# Patient Record
Sex: Male | Born: 1945 | Race: White | Hispanic: No | Marital: Married | Smoking: Former smoker
Health system: Southern US, Community
[De-identification: ages and names within clinical notes are randomized; demographics above are authoritative.]

## PROBLEM LIST (undated history)

## (undated) ENCOUNTER — Emergency Department (HOSPITAL_COMMUNITY): Admission: EM | Payer: Medicare Other

## (undated) DIAGNOSIS — Z8719 Personal history of other diseases of the digestive system: Secondary | ICD-10-CM

## (undated) DIAGNOSIS — M549 Dorsalgia, unspecified: Secondary | ICD-10-CM

## (undated) DIAGNOSIS — I10 Essential (primary) hypertension: Secondary | ICD-10-CM

## (undated) DIAGNOSIS — M1990 Unspecified osteoarthritis, unspecified site: Secondary | ICD-10-CM

## (undated) DIAGNOSIS — G4730 Sleep apnea, unspecified: Secondary | ICD-10-CM

## (undated) DIAGNOSIS — K219 Gastro-esophageal reflux disease without esophagitis: Secondary | ICD-10-CM

## (undated) DIAGNOSIS — G8929 Other chronic pain: Secondary | ICD-10-CM

## (undated) DIAGNOSIS — Z8711 Personal history of peptic ulcer disease: Secondary | ICD-10-CM

## (undated) DIAGNOSIS — G510 Bell's palsy: Secondary | ICD-10-CM

## (undated) DIAGNOSIS — Z87442 Personal history of urinary calculi: Secondary | ICD-10-CM

## (undated) DIAGNOSIS — J189 Pneumonia, unspecified organism: Secondary | ICD-10-CM

## (undated) HISTORY — PX: CATARACT EXTRACTION W/ INTRAOCULAR LENS  IMPLANT, BILATERAL: SHX1307

## (undated) HISTORY — PX: FRACTURE SURGERY: SHX138

## (undated) HISTORY — PX: TONSILLECTOMY: SUR1361

## (undated) HISTORY — PX: EXTERNAL FIXATION ARM: SHX1552

## (undated) HISTORY — PX: JOINT REPLACEMENT: SHX530

## (undated) HISTORY — PX: BACK SURGERY: SHX140

## (undated) SURGERY — ARTHROSCOPY, KNEE
Anesthesia: General | Laterality: Right

---
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
## 1968-10-01 HISTORY — PX: HAND SURGERY: SHX662

## 1983-10-02 HISTORY — PX: FOOT SURGERY: SHX648

## 1983-10-02 HISTORY — PX: HAND HARDWARE REMOVAL: SUR1125

## 1996-10-01 HISTORY — PX: LAMINECTOMY AND MICRODISCECTOMY THORACIC SPINE: SHX1915

## 2008-03-24 ENCOUNTER — Emergency Department (HOSPITAL_COMMUNITY): Admission: EM | Admit: 2008-03-24 | Discharge: 2008-03-24 | Payer: Self-pay | Admitting: Emergency Medicine

## 2008-03-25 ENCOUNTER — Emergency Department (HOSPITAL_COMMUNITY): Admission: EM | Admit: 2008-03-25 | Discharge: 2008-03-25 | Payer: Self-pay | Admitting: Emergency Medicine

## 2010-11-17 ENCOUNTER — Other Ambulatory Visit: Payer: Self-pay | Admitting: Family Medicine

## 2010-11-17 DIAGNOSIS — M545 Low back pain: Secondary | ICD-10-CM

## 2010-11-20 ENCOUNTER — Other Ambulatory Visit: Payer: Self-pay | Admitting: Family Medicine

## 2010-11-20 ENCOUNTER — Ambulatory Visit
Admission: RE | Admit: 2010-11-20 | Discharge: 2010-11-20 | Disposition: A | Discharge status: Home / Self Care | Payer: Medicaid Other | Source: Ambulatory Visit | Attending: Family Medicine | Admitting: Family Medicine

## 2010-11-20 DIAGNOSIS — W3400XA Accidental discharge from unspecified firearms or gun, initial encounter: Secondary | ICD-10-CM

## 2010-11-22 ENCOUNTER — Other Ambulatory Visit: Payer: Self-pay

## 2011-01-17 ENCOUNTER — Ambulatory Visit: Payer: Medicaid Other | Attending: Neurosurgery | Admitting: Rehabilitative and Restorative Service Providers"

## 2011-01-17 DIAGNOSIS — M2569 Stiffness of other specified joint, not elsewhere classified: Secondary | ICD-10-CM | POA: Insufficient documentation

## 2011-01-17 DIAGNOSIS — M545 Low back pain, unspecified: Secondary | ICD-10-CM | POA: Insufficient documentation

## 2011-01-17 DIAGNOSIS — IMO0001 Reserved for inherently not codable concepts without codable children: Secondary | ICD-10-CM | POA: Insufficient documentation

## 2011-01-29 ENCOUNTER — Ambulatory Visit: Payer: Medicaid Other

## 2011-02-05 ENCOUNTER — Ambulatory Visit: Payer: Medicaid Other | Attending: Neurosurgery | Admitting: Rehabilitative and Restorative Service Providers"

## 2011-02-05 DIAGNOSIS — M545 Low back pain, unspecified: Secondary | ICD-10-CM | POA: Insufficient documentation

## 2011-02-05 DIAGNOSIS — M2569 Stiffness of other specified joint, not elsewhere classified: Secondary | ICD-10-CM | POA: Insufficient documentation

## 2011-02-05 DIAGNOSIS — IMO0001 Reserved for inherently not codable concepts without codable children: Secondary | ICD-10-CM | POA: Insufficient documentation

## 2011-02-12 ENCOUNTER — Ambulatory Visit: Payer: Medicaid Other | Admitting: Rehabilitative and Restorative Service Providers"

## 2011-02-14 ENCOUNTER — Encounter: Payer: Medicaid Other | Admitting: Rehabilitative and Restorative Service Providers"

## 2011-06-28 LAB — DIFFERENTIAL
Basophils Relative: 0
Eosinophils Absolute: 0.2
Neutrophils Relative %: 62

## 2011-06-28 LAB — CBC
MCHC: 34.4
MCV: 94.6
Platelets: 353
RDW: 12.8
WBC: 9.3

## 2011-06-28 LAB — POCT I-STAT, CHEM 8
Calcium, Ion: 0.99 — ABNORMAL LOW
Creatinine, Ser: 1.3
Glucose, Bld: 137 — ABNORMAL HIGH
HCT: 42
Hemoglobin: 14.3

## 2011-06-28 LAB — PROTIME-INR
INR: 0.9
Prothrombin Time: 12.5

## 2011-09-04 ENCOUNTER — Other Ambulatory Visit: Payer: Self-pay | Admitting: Orthopedic Surgery

## 2011-09-04 ENCOUNTER — Ambulatory Visit
Admission: RE | Admit: 2011-09-04 | Discharge: 2011-09-04 | Disposition: A | Discharge status: Home / Self Care | Payer: Medicare Other | Source: Ambulatory Visit | Attending: Orthopedic Surgery | Admitting: Orthopedic Surgery

## 2011-09-04 DIAGNOSIS — R609 Edema, unspecified: Secondary | ICD-10-CM

## 2011-09-04 DIAGNOSIS — R52 Pain, unspecified: Secondary | ICD-10-CM

## 2012-12-08 ENCOUNTER — Other Ambulatory Visit: Payer: Self-pay | Admitting: Orthopedic Surgery

## 2012-12-08 ENCOUNTER — Ambulatory Visit
Admission: RE | Admit: 2012-12-08 | Discharge: 2012-12-08 | Disposition: A | Discharge status: Home / Self Care | Payer: Medicare Other | Source: Ambulatory Visit | Attending: Orthopedic Surgery | Admitting: Orthopedic Surgery

## 2012-12-15 ENCOUNTER — Encounter (HOSPITAL_COMMUNITY): Payer: Self-pay | Admitting: Pharmacy Technician

## 2012-12-16 ENCOUNTER — Encounter (HOSPITAL_COMMUNITY)
Admission: RE | Admit: 2012-12-16 | Discharge: 2012-12-16 | Disposition: A | Discharge status: Home / Self Care | Payer: Medicare Other | Source: Ambulatory Visit | Attending: Orthopedic Surgery | Admitting: Orthopedic Surgery

## 2012-12-16 ENCOUNTER — Other Ambulatory Visit: Payer: Self-pay | Admitting: Orthopedic Surgery

## 2012-12-16 ENCOUNTER — Encounter (HOSPITAL_COMMUNITY): Payer: Self-pay

## 2012-12-16 HISTORY — DX: Gastro-esophageal reflux disease without esophagitis: K21.9

## 2012-12-16 HISTORY — DX: Sleep apnea, unspecified: G47.30

## 2012-12-16 HISTORY — DX: Essential (primary) hypertension: I10

## 2012-12-16 HISTORY — DX: Unspecified osteoarthritis, unspecified site: M19.90

## 2012-12-16 HISTORY — DX: Pneumonia, unspecified organism: J18.9

## 2012-12-16 HISTORY — DX: Bell's palsy: G51.0

## 2012-12-16 LAB — COMPREHENSIVE METABOLIC PANEL
AST: 17 U/L (ref 0–37)
Albumin: 3.7 g/dL (ref 3.5–5.2)
BUN: 17 mg/dL (ref 6–23)
Chloride: 97 mEq/L (ref 96–112)
Creatinine, Ser: 0.75 mg/dL (ref 0.50–1.35)
Total Bilirubin: 0.2 mg/dL — ABNORMAL LOW (ref 0.3–1.2)
Total Protein: 7.2 g/dL (ref 6.0–8.3)

## 2012-12-16 LAB — URINALYSIS, ROUTINE W REFLEX MICROSCOPIC
Glucose, UA: 100 mg/dL — AB
Hgb urine dipstick: NEGATIVE
Leukocytes, UA: NEGATIVE
Specific Gravity, Urine: 1.023 (ref 1.005–1.030)
pH: 7 (ref 5.0–8.0)

## 2012-12-16 LAB — CBC
HCT: 38.3 % — ABNORMAL LOW (ref 39.0–52.0)
MCHC: 35.2 g/dL (ref 30.0–36.0)
MCV: 95.3 fL (ref 78.0–100.0)
Platelets: 414 10*3/uL — ABNORMAL HIGH (ref 150–400)
RDW: 12.5 % (ref 11.5–15.5)
WBC: 16.5 10*3/uL — ABNORMAL HIGH (ref 4.0–10.5)

## 2012-12-16 NOTE — Pre-Procedure Instructions (Addendum)
Jaquae Rieves  12/16/2012   Your procedure is scheduled on:  12/18/12  Report to [facility] at 800 AM.  Call this number if you have problems the morning of surgery: [phone]   Remember:   Do not eat food or drink liquids after midnight.   Take these medicines the morning of surgery with A SIP OF WATER: pain med STOP aspirin, lodine(etodolac) now   Do not wear jewelry, make-up or nail polish.  Do not wear lotions, powders, or perfumes. You may wear deodorant.  Do not shave 48 hours prior to surgery. Men may shave face and neck.  Do not bring valuables to the hospital.  Contacts, dentures or bridgework may not be worn into surgery.  Leave suitcase in the car. After surgery it may be brought to your room.  For patients admitted to the hospital, checkout time is 11:00 AM the day of  discharge.   Patients discharged the day of surgery will not be allowed to drive  home.  Name and phone number of your driver:   Special Instructions: Shower using CHG 2 nights before surgery and the night before surgery.  If you shower the day of surgery use CHG.  Use special wash - you have one bottle of CHG for all showers.  You should use approximately 1/3 of the bottle for each shower.   Please read over the following fact sheets that you were given: Pain Booklet, Coughing and Deep Breathing, MRSA Information and Surgical Site Infection Prevention

## 2012-12-16 NOTE — Progress Notes (Signed)
Called dr Windle Guard office for prior ekg, notes. Not sure if message recorded. Chart left for allison z pa to review.

## 2012-12-17 MED ORDER — VANCOMYCIN HCL IN DEXTROSE 1-5 GM/200ML-% IV SOLN
1000.0000 mg | INTRAVENOUS | Status: DC
  Filled 2012-12-17: qty 200

## 2012-12-17 NOTE — Progress Notes (Addendum)
I received a call from [facility] in the lab , she said that they were unable to add the Diff to CBC done yesterday.  Pt has a cbc and Diff ordered for tomorrow.  I notified Dr Montez Morita.

## 2012-12-17 NOTE — Progress Notes (Signed)
Anesthesia chart review: Patient is a 67 year old male scheduled for right knee arthroscopy with Dr. Montez Morita on 12/18/2012. History includes former smoker, hypertension, pneumonia, GERD, obstructive sleep apnea with occasional use of CPAP, arthritis, Bell's palsy '90's, multiple orthopedic procedures. PCP is Dr. Windle Guard.  CXR on 12/16/12 showed: Hyperexpanded lungs and bronchitic change without acute cardiopulmonary disease.  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 The interpreting cardiologist did not feel that it was significantly changed from his previous EKG on 03/24/08 (see Muse). No CV symptoms were documented at his PAT visit.  He has no known history of MI/CHF or DM.  Pre-operative labs noted.  CBC results called to Dr. Celene Skeen office.  He would like a differential added if possible and to repeat a CBC with differential on arrival tomorrow (ordered).  He will be evaluated by his assigned anesthesiologist on the day of surgery.  If no acute changes and follow-up labs felt acceptable then would anticipate he could proceed as planned.  Velna Ochs [facility]/Anesthesiology Phone [phone] 12/17/2012 1:11 PM

## 2012-12-18 ENCOUNTER — Ambulatory Visit (HOSPITAL_COMMUNITY)
Admission: RE | Admit: 2012-12-18 | Discharge: 2012-12-18 | Disposition: A | Discharge status: Home / Self Care | Payer: Medicare Other | Source: Ambulatory Visit | Attending: Orthopedic Surgery | Admitting: Orthopedic Surgery

## 2012-12-18 ENCOUNTER — Ambulatory Visit (HOSPITAL_COMMUNITY): Payer: Medicare Other | Admitting: Certified Registered"

## 2012-12-18 ENCOUNTER — Encounter (HOSPITAL_COMMUNITY)
Admission: RE | Disposition: A | Discharge status: Home / Self Care | Payer: Self-pay | Source: Ambulatory Visit | Attending: Orthopedic Surgery

## 2012-12-18 ENCOUNTER — Encounter (HOSPITAL_COMMUNITY): Payer: Self-pay | Admitting: Vascular Surgery

## 2012-12-18 ENCOUNTER — Encounter (HOSPITAL_COMMUNITY): Payer: Self-pay | Admitting: *Deleted

## 2012-12-18 DIAGNOSIS — M6750 Plica syndrome, unspecified knee: Secondary | ICD-10-CM | POA: Insufficient documentation

## 2012-12-18 DIAGNOSIS — M959 Acquired deformity of musculoskeletal system, unspecified: Secondary | ICD-10-CM | POA: Insufficient documentation

## 2012-12-18 DIAGNOSIS — Z01812 Encounter for preprocedural laboratory examination: Secondary | ICD-10-CM | POA: Insufficient documentation

## 2012-12-18 DIAGNOSIS — G8918 Other acute postprocedural pain: Secondary | ICD-10-CM

## 2012-12-18 DIAGNOSIS — Z01810 Encounter for preprocedural cardiovascular examination: Secondary | ICD-10-CM | POA: Insufficient documentation

## 2012-12-18 DIAGNOSIS — Z01818 Encounter for other preprocedural examination: Secondary | ICD-10-CM | POA: Insufficient documentation

## 2012-12-18 DIAGNOSIS — M23329 Other meniscus derangements, posterior horn of medial meniscus, unspecified knee: Secondary | ICD-10-CM | POA: Insufficient documentation

## 2012-12-18 DIAGNOSIS — M2340 Loose body in knee, unspecified knee: Secondary | ICD-10-CM | POA: Insufficient documentation

## 2012-12-18 DIAGNOSIS — I10 Essential (primary) hypertension: Secondary | ICD-10-CM | POA: Insufficient documentation

## 2012-12-18 HISTORY — PX: KNEE ARTHROSCOPY: SHX127

## 2012-12-18 LAB — CBC
HCT: 37.3 % — ABNORMAL LOW (ref 39.0–52.0)
Hemoglobin: 12.6 g/dL — ABNORMAL LOW (ref 13.0–17.0)
MCV: 97.6 fL (ref 78.0–100.0)
RBC: 3.82 MIL/uL — ABNORMAL LOW (ref 4.22–5.81)
RDW: 12.9 % (ref 11.5–15.5)
WBC: 9.4 10*3/uL (ref 4.0–10.5)

## 2012-12-18 LAB — DIFFERENTIAL
Basophils Absolute: 0 10*3/uL (ref 0.0–0.1)
Eosinophils Relative: 2 % (ref 0–5)
Lymphocytes Relative: 38 % (ref 12–46)
Lymphs Abs: 3.5 10*3/uL (ref 0.7–4.0)
Monocytes Absolute: 1.1 10*3/uL — ABNORMAL HIGH (ref 0.1–1.0)
Neutro Abs: 4.5 10*3/uL (ref 1.7–7.7)

## 2012-12-18 SURGERY — ARTHROSCOPY, KNEE
Anesthesia: General | Site: Knee | Laterality: Right | Wound class: Clean

## 2012-12-18 MED ORDER — OXYCODONE HCL 5 MG/5ML PO SOLN: 5.0000 mg | Freq: Once | ORAL | Status: DC | PRN

## 2012-12-18 MED ORDER — HYDROMORPHONE HCL PF 1 MG/ML IJ SOLN
INTRAMUSCULAR | Status: AC
  Filled 2012-12-18: qty 1

## 2012-12-18 MED ORDER — MIDAZOLAM HCL 5 MG/5ML IJ SOLN
INTRAMUSCULAR | Status: DC | PRN
  Administered 2012-12-18: 2 mg via INTRAVENOUS

## 2012-12-18 MED ORDER — HYDROMORPHONE HCL PF 1 MG/ML IJ SOLN
0.2500 mg | INTRAMUSCULAR | Status: DC | PRN
  Administered 2012-12-18 (×2): 0.25 mg via INTRAVENOUS

## 2012-12-18 MED ORDER — ONDANSETRON HCL 4 MG/2ML IJ SOLN
INTRAMUSCULAR | Status: DC | PRN
  Administered 2012-12-18: 4 mg via INTRAVENOUS

## 2012-12-18 MED ORDER — OXYCODONE HCL 5 MG PO TABS
5.0000 mg | ORAL_TABLET | Freq: Once | ORAL | Status: DC | PRN
Start: 2012-12-18 — End: 2012-12-18

## 2012-12-18 MED ORDER — BUPIVACAINE HCL (PF) 0.25 % IJ SOLN
INTRAMUSCULAR | Status: DC | PRN
  Administered 2012-12-18: 30 mL via INTRA_ARTICULAR

## 2012-12-18 MED ORDER — KETOROLAC TROMETHAMINE 30 MG/ML IJ SOLN
INTRAMUSCULAR | Status: DC | PRN
  Administered 2012-12-18: 30 mg via INTRAVENOUS

## 2012-12-18 MED ORDER — CHLORHEXIDINE GLUCONATE 4 % EX LIQD: 60.0000 mL | Freq: Once | CUTANEOUS | Status: DC

## 2012-12-18 MED ORDER — LIDOCAINE HCL (CARDIAC) 20 MG/ML IV SOLN
INTRAVENOUS | Status: DC | PRN
  Administered 2012-12-18: 80 mg via INTRAVENOUS

## 2012-12-18 MED ORDER — PROMETHAZINE HCL 25 MG/ML IJ SOLN: 6.2500 mg | INTRAMUSCULAR | Status: DC | PRN

## 2012-12-18 MED ORDER — MEPERIDINE HCL 25 MG/ML IJ SOLN: 6.2500 mg | INTRAMUSCULAR | Status: DC | PRN

## 2012-12-18 MED ORDER — OXYCODONE-ACETAMINOPHEN 7.5-325 MG PO TABS: 1.0000 | ORAL_TABLET | ORAL | Status: DC | PRN

## 2012-12-18 MED ORDER — LACTATED RINGERS IV SOLN
INTRAVENOUS | Status: DC
  Administered 2012-12-18: 09:00:00 via INTRAVENOUS

## 2012-12-18 MED ORDER — FENTANYL CITRATE 0.05 MG/ML IJ SOLN
INTRAMUSCULAR | Status: DC | PRN
  Administered 2012-12-18: 25 ug via INTRAVENOUS

## 2012-12-18 MED ORDER — LACTATED RINGERS IV SOLN
INTRAVENOUS | Status: DC | PRN
  Administered 2012-12-18: 09:00:00 via INTRAVENOUS

## 2012-12-18 MED ORDER — PROPOFOL 10 MG/ML IV BOLUS
INTRAVENOUS | Status: DC | PRN
  Administered 2012-12-18: 170 mg via INTRAVENOUS

## 2012-12-18 MED ORDER — SODIUM CHLORIDE 0.9 % IR SOLN
Status: DC | PRN
  Administered 2012-12-18: 6000 mL

## 2012-12-18 MED ORDER — ACETAMINOPHEN 10 MG/ML IV SOLN
INTRAVENOUS | Status: AC
  Filled 2012-12-18: qty 100

## 2012-12-18 MED ORDER — BUPIVACAINE HCL (PF) 0.25 % IJ SOLN
INTRAMUSCULAR | Status: AC
  Filled 2012-12-18: qty 30

## 2012-12-18 SURGICAL SUPPLY — 37 items
BANDAGE ELASTIC 4 VELCRO ST LF (GAUZE/BANDAGES/DRESSINGS) ×2 IMPLANT
BANDAGE ELASTIC 6 VELCRO ST LF (GAUZE/BANDAGES/DRESSINGS) ×2 IMPLANT
BANDAGE GAUZE ELAST BULKY 4 IN (GAUZE/BANDAGES/DRESSINGS) ×2 IMPLANT
BLADE CUDA 5.5 (BLADE) IMPLANT
BLADE GREAT WHITE 4.2 (BLADE) ×2 IMPLANT
CLOTH BEACON ORANGE TIMEOUT ST (SAFETY) ×2 IMPLANT
COVER SURGICAL LIGHT HANDLE (MISCELLANEOUS) ×2 IMPLANT
CUFF TOURNIQUET SINGLE 34IN LL (TOURNIQUET CUFF) IMPLANT
CUFF TOURNIQUET SINGLE 44IN (TOURNIQUET CUFF) IMPLANT
DECANTER SPIKE VIAL GLASS SM (MISCELLANEOUS) ×2 IMPLANT
DRAPE ARTHROSCOPY W/POUCH 114 (DRAPES) ×2 IMPLANT
DRAPE U-SHAPE 47X51 STRL (DRAPES) ×2 IMPLANT
DRSG EMULSION OIL 3X3 NADH (GAUZE/BANDAGES/DRESSINGS) ×2 IMPLANT
DRSG PAD ABDOMINAL 8X10 ST (GAUZE/BANDAGES/DRESSINGS) ×2 IMPLANT
DURAPREP 26ML APPLICATOR (WOUND CARE) ×2 IMPLANT
GLOVE BIO SURGEON STRL SZ 6.5 (GLOVE) ×2 IMPLANT
GLOVE BIOGEL PI IND STRL 7.0 (GLOVE) ×2 IMPLANT
GLOVE BIOGEL PI IND STRL 7.5 (GLOVE) ×1 IMPLANT
GLOVE BIOGEL PI INDICATOR 7.0 (GLOVE) ×2
GLOVE BIOGEL PI INDICATOR 7.5 (GLOVE) ×1
GLOVE SS PI 9.0 STRL (GLOVE) ×2 IMPLANT
GOWN PREVENTION PLUS XLARGE (GOWN DISPOSABLE) ×2 IMPLANT
GOWN STRL NON-REIN LRG LVL3 (GOWN DISPOSABLE) ×2 IMPLANT
GOWN STRL REIN XL XLG (GOWN DISPOSABLE) ×4 IMPLANT
KIT BASIN OR (CUSTOM PROCEDURE TRAY) ×2 IMPLANT
KIT ROOM TURNOVER OR (KITS) ×2 IMPLANT
MANIFOLD NEPTUNE II (INSTRUMENTS) ×2 IMPLANT
PACK ARTHROSCOPY DSU (CUSTOM PROCEDURE TRAY) ×2 IMPLANT
PAD ARMBOARD 7.5X6 YLW CONV (MISCELLANEOUS) ×4 IMPLANT
SET ARTHROSCOPY TUBING (MISCELLANEOUS) ×2
SET ARTHROSCOPY TUBING LN (MISCELLANEOUS) ×1 IMPLANT
SPONGE GAUZE 4X4 12PLY (GAUZE/BANDAGES/DRESSINGS) ×2 IMPLANT
SPONGE LAP 4X18 X RAY DECT (DISPOSABLE) ×2 IMPLANT
SUT ETHILON 4 0 PS 2 18 (SUTURE) ×2 IMPLANT
SYR CONTROL 10ML LL (SYRINGE) ×2 IMPLANT
TOWEL OR 17X24 6PK STRL BLUE (TOWEL DISPOSABLE) ×2 IMPLANT
WATER STERILE IRR 1000ML POUR (IV SOLUTION) ×2 IMPLANT

## 2012-12-18 NOTE — Preoperative (Signed)
Beta Blockers   Reason not to administer Beta Blockers:Not Applicable 

## 2012-12-18 NOTE — Anesthesia Procedure Notes (Signed)
Procedure Name: LMA Insertion Date/Time: 12/18/2012 9:46 AM Performed by: Jerilee Hoh Pre-anesthesia Checklist: Patient identified, Emergency Drugs available, Suction available and Patient being monitored Patient Re-evaluated:Patient Re-evaluated prior to inductionOxygen Delivery Method: Circle system utilized Preoxygenation: Pre-oxygenation with 100% oxygen Intubation Type: IV induction LMA: LMA inserted LMA Size: 4.0 Tube type: Oral Number of attempts: 1 Placement Confirmation: positive ETCO2 and breath sounds checked- equal and bilateral Tube secured with: Tape Dental Injury: Teeth and Oropharynx as per pre-operative assessment

## 2012-12-18 NOTE — Anesthesia Preprocedure Evaluation (Signed)
Anesthesia Evaluation  Patient identified by MRN, date of birth, ID band Patient awake    Reviewed: Allergy & Precautions, H&P , NPO status , Patient's Chart, lab work & pertinent test results  Airway Mallampati: I  Neck ROM: Full    Dental  (+) Partial Lower and Poor Dentition   Pulmonary sleep apnea , pneumonia -,  breath sounds clear to auscultation        Cardiovascular hypertension, Rhythm:Regular Rate:Normal     Neuro/Psych negative neurological ROS     GI/Hepatic Neg liver ROS, GERD-  ,  Endo/Other    Renal/GU negative Renal ROS     Musculoskeletal   Abdominal   Peds  Hematology   Anesthesia Other Findings   Reproductive/Obstetrics                           Anesthesia Physical Anesthesia Plan  ASA: II  Anesthesia Plan: General   Post-op Pain Management:    Induction: Intravenous  Airway Management Planned: LMA  Additional Equipment:   Intra-op Plan:   Post-operative Plan: Extubation in OR  Informed Consent: I have reviewed the patients History and Physical, chart, labs and discussed the procedure including the risks, benefits and alternatives for the proposed anesthesia with the patient or authorized representative who has indicated his/her understanding and acceptance.   Dental advisory given  Plan Discussed with: CRNA and Surgeon  Anesthesia Plan Comments:         Anesthesia Quick Evaluation

## 2012-12-18 NOTE — Progress Notes (Signed)
Orthopedic Tech Progress Note Patient Details:  Corey Carter 01-24-1946 [id number] Patient provided crutches in short stay. Proper crutch technique discussed with patient. Ortho Devices Type of Ortho Device: Crutches Ortho Device/Splint Interventions: Ordered   [place] 12/18/2012, 12:55 PM

## 2012-12-18 NOTE — Progress Notes (Signed)
Relieved for lunch break per [facility]

## 2012-12-18 NOTE — Anesthesia Postprocedure Evaluation (Signed)
  Anesthesia Post-op Note  Patient: Corey Carter  Procedure(s) Performed: Procedure(s): ARTHROSCOPY KNEE (Right)  Patient Location: PACU  Anesthesia Type:General  Level of Consciousness: awake  Airway and Oxygen Therapy: Patient Spontanous Breathing  Post-op Pain: mild  Post-op Assessment: Post-op Vital signs reviewed  Post-op Vital Signs: stable  Complications: No apparent anesthesia complications

## 2012-12-18 NOTE — Brief Op Note (Signed)
12/18/2012  10:40 AM  PATIENT:  Corey Carter  67 y.o. male  PRE-OPERATIVE DIAGNOSIS:  INTERNAL DERANGEMENT RIGHT KNEE  POST-OPERATIVE DIAGNOSIS:  * No post-op diagnosis entered *  PROCEDURE:  Procedure(s): ARTHROSCOPY KNEE (Right)  SURGEON:  Surgeon(s) and Role:    * Kennieth Rad, MD - Primary  PHYSICIAN ASSISTANT:   ASSISTANTS: none   ANESTHESIA:   general  EBL:  Total I/O In: 700 [I.V.:700] Out: 50 [Blood:50]  BLOOD ADMINISTERED:none  DRAINS: none   LOCAL MEDICATIONS USED:  MARCAINE     SPECIMEN:  No Specimen  DISPOSITION OF SPECIMEN:  N/A  COUNTS:  YES  TOURNIQUET:   Total Tourniquet Time Documented: Thigh (Right) - 30 minutes Total: Thigh (Right) - 30 minutes   DICTATION: .Other Dictation: Dictation Number REPORT #[id number]  PLAN OF CARE: Discharge to home after PACU  PATIENT DISPOSITION:  PACU - hemodynamically stable.   Delay start of Pharmacological VTE agent (>24hrs) due to surgical blood loss or risk of bleeding: not applicable
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
## 2012-12-18 NOTE — Transfer of Care (Signed)
Immediate Anesthesia Transfer of Care Note  Patient: Corey Carter  Procedure(s) Performed: Procedure(s): ARTHROSCOPY KNEE (Right)  Patient Location: PACU  Anesthesia Type:General  Level of Consciousness: awake, alert , oriented and patient cooperative  Airway & Oxygen Therapy: Patient Spontanous Breathing and Patient connected to nasal cannula oxygen  Post-op Assessment: Report given to PACU RN, Post -op Vital signs reviewed and stable and Patient moving all extremities  Post vital signs: Reviewed and stable  Complications: No apparent anesthesia complications

## 2012-12-19 ENCOUNTER — Encounter (HOSPITAL_COMMUNITY): Payer: Self-pay | Admitting: Orthopedic Surgery

## 2012-12-19 NOTE — Op Note (Signed)
NAME:  Corey Carter, Corey Carter NO.:  [id number]  MEDICAL RECORD NO.:  [id number]  LOCATION:  [facility]                         FACILITY:  [facility]  PHYSICIAN:  Myrtie Neither, MD      DATE OF BIRTH:  [birth year]  DATE OF PROCEDURE:  12/18/2012 DATE OF DISCHARGE:  12/18/2012                              OPERATIVE REPORT   PREOPERATIVE DIAGNOSIS:  Internal derangement of left knee.  POSTOPERATIVE DIAGNOSIS:  Medial meniscal tear, patella plica, chondral defect medial femoral condyle, and loose bodies, left knee.  ANESTHESIA:  General.  PROCEDURE:  Arthroscopic partial medial meniscectomy, complete synovectomy, excision patella plica, and abrasive chondroplasty medial femoral condyle.  DESCRIPTION OF PROCEDURE:  The patient was taken to the operating room. After given adequate preop medications, given general anesthesia and intubated.  Left knee was prepped with DuraPrep and draped in a sterile manner.  Tourniquet was used for hemostasis.  0.5 inch puncture wound made along the anterior medial and lateral joint line.  The inflow was through the medial suprapatellar pouch area.  Inspection of the joint revealed degenerative tear of medial meniscus and along the posterior horn with large condyle defect with multiple loose fragments about the femoral condyle.  Large thickened patellar plica with chronic synovitic changes.  With synovial shaver, complete synovectomy was done with excision of patellar plica, loose fragments were also removed with basket forceps, partial medial meniscectomy was done.  With the aid of rasp as well as some meniscal shaver, abrasive chondroplasty was done of the medial femoral condyle.  Size of the lesion medially was that of the size of a silver dollar.  Copious and abundant irrigation was done. Other loose fragments were removed.  Lateral compartment was fairly well preserved.  Some degenerative changes about the peripheral edges of the lateral  meniscus.  ACL was intact.  Copious irrigation was done followed by wound closure with 4-0 nylon, and 14 mL of plain Marcaine injected into the knee.  Compressive dressing was applied.  The patient tolerated the procedure quite well, went to recovery room in stable and satisfactory condition.  The patient will be discharged home with use of crutches, nonweightbearing on the left side, Percocet 1-2 q.4 p.r.n. for pain, ice packs, and to return to the office in 1 week.  The patient being discharged in stable and satisfactory condition.     Myrtie Neither, MD     AC/MEDQ  D:  12/18/2012  T:  12/19/2012  Job:  [id number]

## 2012-12-19 NOTE — H&P (Signed)
NAME:  Corey Carter, Corey Carter NO.:  [id number]  MEDICAL RECORD NO.:  [id number]  LOCATION:  [facility]                         FACILITY:  [facility]  PHYSICIAN:  Myrtie Neither, MD      DATE OF BIRTH:  Aug 25, 1946  DATE OF ADMISSION:  12/18/2012 DATE OF DISCHARGE:  12/18/2012                             HISTORY & PHYSICAL   CHIEF COMPLAINT:  Painful left knee.  HISTORY OF PRESENT ILLNESS:  This is a 67 year old male who is being followed in the office over the past year for internal derangement of the left knee.  The patient had been treated with anti-inflammatories, pain medication, therapeutic injections, and home therapy.  The patient did quite well over the past few months and progressively worsened with increased pain, persistent buckling and giving way of the left knee.  MEDICATIONS: 1. Aspirin 81 mg. 2. Vitamin D daily. 3. Lodine 400 mg b.i.d. 4. HydroDIURIL 25 mg daily. 5. Norco 10/325 q.8 hours p.r.n. 6. Lisinopril 20 mg daily. 7. Omeprazole 20 mg daily.  ALLERGIES:  PENICILLIN.  SOCIAL HISTORY:  No history of use of tobacco, alcohol, or illegal drugs.  FAMILY HISTORY:  Noncontributory.  REVIEW OF SYSTEMS:  Basically that of left knee pain.  No cardiac, respiratory, urinary or bowel symptoms.  PAST SURGICAL HISTORY:  Previous surgery was that of right hand surgery for an old injury with reconstructive plastic surgery with use of skin flap.  PHYSICAL EXAMINATION:  GENERAL:  Alert and oriented, no acute distress. VITAL SIGNS:  Temperature 98.1, pulse 58, respirations 20, blood pressure 152/74, O2 saturation is 98%. HEENT:  Head; normocephalic.  Eyes; conjunctivae and sclerae clear. NECK:  Supple. CHEST:  Clear. CARDIAC:  S1 and S2 regular. EXTREMITIES:  Left knee genu varum, crepitus medial compartment, positive McMurray's test with palpable audible click.  +2 effusion. Range of motion is good with pain on full flexion and full extension. Negative drawer,  negative Lachman's test.  X-rays revealed decreased joint space medial compartment with effusion.  IMPRESSION:  Internal derangement, left knee.  PLAN:  Arthroscopy, left knee.     Myrtie Neither, MD     AC/MEDQ  D:  12/18/2012  T:  12/19/2012  Job:  [id number]

## 2013-01-13 ENCOUNTER — Ambulatory Visit: Payer: Medicare Other | Attending: Orthopedic Surgery

## 2013-01-13 DIAGNOSIS — M25569 Pain in unspecified knee: Secondary | ICD-10-CM | POA: Insufficient documentation

## 2013-01-13 DIAGNOSIS — IMO0001 Reserved for inherently not codable concepts without codable children: Secondary | ICD-10-CM | POA: Insufficient documentation

## 2013-01-13 DIAGNOSIS — R269 Unspecified abnormalities of gait and mobility: Secondary | ICD-10-CM | POA: Insufficient documentation

## 2013-01-13 DIAGNOSIS — M25669 Stiffness of unspecified knee, not elsewhere classified: Secondary | ICD-10-CM | POA: Insufficient documentation

## 2013-01-19 ENCOUNTER — Ambulatory Visit: Payer: Medicare Other | Admitting: Rehabilitation

## 2013-02-02 ENCOUNTER — Ambulatory Visit: Payer: Medicare Other | Attending: Orthopedic Surgery

## 2013-02-02 DIAGNOSIS — IMO0001 Reserved for inherently not codable concepts without codable children: Secondary | ICD-10-CM | POA: Insufficient documentation

## 2013-02-02 DIAGNOSIS — M25669 Stiffness of unspecified knee, not elsewhere classified: Secondary | ICD-10-CM | POA: Insufficient documentation

## 2013-02-02 DIAGNOSIS — M25569 Pain in unspecified knee: Secondary | ICD-10-CM | POA: Insufficient documentation

## 2013-02-02 DIAGNOSIS — R269 Unspecified abnormalities of gait and mobility: Secondary | ICD-10-CM | POA: Insufficient documentation

## 2013-02-16 ENCOUNTER — Ambulatory Visit: Payer: Medicare Other

## 2013-03-06 ENCOUNTER — Ambulatory Visit
Admission: RE | Admit: 2013-03-06 | Discharge: 2013-03-06 | Disposition: A | Discharge status: Home / Self Care | Payer: Medicare Other | Source: Ambulatory Visit | Attending: Orthopedic Surgery | Admitting: Orthopedic Surgery

## 2013-03-06 ENCOUNTER — Other Ambulatory Visit: Payer: Self-pay | Admitting: Orthopedic Surgery

## 2013-03-06 DIAGNOSIS — M25461 Effusion, right knee: Secondary | ICD-10-CM

## 2013-03-06 DIAGNOSIS — R52 Pain, unspecified: Secondary | ICD-10-CM

## 2014-06-02 ENCOUNTER — Encounter (HOSPITAL_COMMUNITY): Payer: Self-pay | Admitting: Emergency Medicine

## 2014-06-02 ENCOUNTER — Emergency Department (HOSPITAL_COMMUNITY)
Admission: EM | Admit: 2014-06-02 | Discharge: 2014-06-02 | Disposition: A | Discharge status: Home / Self Care | Payer: Medicare Other | Attending: Emergency Medicine | Admitting: Emergency Medicine

## 2014-06-02 DIAGNOSIS — Z880 Allergy status to penicillin: Secondary | ICD-10-CM | POA: Diagnosis not present

## 2014-06-02 DIAGNOSIS — Z79899 Other long term (current) drug therapy: Secondary | ICD-10-CM | POA: Diagnosis not present

## 2014-06-02 DIAGNOSIS — I951 Orthostatic hypotension: Secondary | ICD-10-CM | POA: Diagnosis not present

## 2014-06-02 DIAGNOSIS — Z791 Long term (current) use of non-steroidal anti-inflammatories (NSAID): Secondary | ICD-10-CM | POA: Diagnosis not present

## 2014-06-02 DIAGNOSIS — I10 Essential (primary) hypertension: Secondary | ICD-10-CM | POA: Diagnosis not present

## 2014-06-02 DIAGNOSIS — Z8669 Personal history of other diseases of the nervous system and sense organs: Secondary | ICD-10-CM | POA: Diagnosis not present

## 2014-06-02 DIAGNOSIS — K219 Gastro-esophageal reflux disease without esophagitis: Secondary | ICD-10-CM | POA: Diagnosis not present

## 2014-06-02 DIAGNOSIS — R55 Syncope and collapse: Secondary | ICD-10-CM | POA: Diagnosis present

## 2014-06-02 DIAGNOSIS — Z87891 Personal history of nicotine dependence: Secondary | ICD-10-CM | POA: Diagnosis not present

## 2014-06-02 DIAGNOSIS — Z7982 Long term (current) use of aspirin: Secondary | ICD-10-CM | POA: Insufficient documentation

## 2014-06-02 DIAGNOSIS — Z8701 Personal history of pneumonia (recurrent): Secondary | ICD-10-CM | POA: Insufficient documentation

## 2014-06-02 DIAGNOSIS — M129 Arthropathy, unspecified: Secondary | ICD-10-CM | POA: Diagnosis not present

## 2014-06-02 LAB — BASIC METABOLIC PANEL
Anion gap: 13 (ref 5–15)
BUN: 31 mg/dL — AB (ref 6–23)
CALCIUM: 8.3 mg/dL — AB (ref 8.4–10.5)
CO2: 22 mEq/L (ref 19–32)
Chloride: 106 mEq/L (ref 96–112)
Creatinine, Ser: 1.39 mg/dL — ABNORMAL HIGH (ref 0.50–1.35)
GFR calc Af Amer: 59 mL/min — ABNORMAL LOW (ref >90)
GFR, EST NON AFRICAN AMERICAN: 51 mL/min — AB (ref >90)
GLUCOSE: 109 mg/dL — AB (ref 70–99)
Potassium: 4 mEq/L (ref 3.7–5.3)
Sodium: 141 mEq/L (ref 137–147)

## 2014-06-02 LAB — CBC WITH DIFFERENTIAL/PLATELET
BASOS PCT: 0 % (ref 0–1)
Basophils Absolute: 0 10*3/uL (ref 0.0–0.1)
EOS ABS: 0 10*3/uL (ref 0.0–0.7)
Eosinophils Relative: 0 % (ref 0–5)
HEMATOCRIT: 36.4 % — AB (ref 39.0–52.0)
HEMOGLOBIN: 12.3 g/dL — AB (ref 13.0–17.0)
Lymphocytes Relative: 7 % — ABNORMAL LOW (ref 12–46)
Lymphs Abs: 1.8 10*3/uL (ref 0.7–4.0)
MCH: 33 pg (ref 26.0–34.0)
MCHC: 33.8 g/dL (ref 30.0–36.0)
MCV: 97.6 fL (ref 78.0–100.0)
MONO ABS: 1.6 10*3/uL — AB (ref 0.1–1.0)
Monocytes Relative: 6 % (ref 3–12)
NEUTROS ABS: 23 10*3/uL — AB (ref 1.7–7.7)
Neutrophils Relative %: 87 % — ABNORMAL HIGH (ref 43–77)
Platelets: 271 10*3/uL (ref 150–400)
RBC: 3.73 MIL/uL — ABNORMAL LOW (ref 4.22–5.81)
RDW: 12.6 % (ref 11.5–15.5)
WBC Morphology: INCREASED
WBC: 26.4 10*3/uL — ABNORMAL HIGH (ref 4.0–10.5)

## 2014-06-02 MED ORDER — POLYETHYLENE GLYCOL 3350 17 G PO PACK: 17.0000 g | PACK | Freq: Every day | ORAL | Status: DC

## 2014-06-02 MED ORDER — SODIUM CHLORIDE 0.9 % IV BOLUS (SEPSIS)
2000.0000 mL | Freq: Once | INTRAVENOUS | Status: AC
  Administered 2014-06-02: 2000 mL via INTRAVENOUS

## 2014-06-02 NOTE — Discharge Instructions (Signed)
Hypotension  As your heart beats, it forces blood through your arteries. This force is your blood pressure. If your blood pressure is too low for you to go about your normal activities or to support the organs of your body, you have hypotension. Hypotension is also referred to as low blood pressure. When your blood pressure becomes too low, you may not get enough blood to your brain. As a result, you may feel weak, feel lightheaded, or develop a rapid heart rate. In a more severe case, you may faint.  CAUSES  Various conditions can cause hypotension. These include:  · Blood loss.  · Dehydration.  · Heart or endocrine problems.  · Pregnancy.  · Severe infection.  · Not having a well-balanced diet filled with needed nutrients.  · Severe allergic reactions (anaphylaxis).  Some medicines, such as blood pressure medicine or water pills (diuretics), may lower your blood pressure below normal. Sometimes taking too much medicine or taking medicine not as directed can cause hypotension.  TREATMENT   Hospitalization is sometimes required for hypotension if fluid or blood replacement is needed, if time is needed for medicines to wear off, or if further monitoring is needed. Treatment might include changing your diet, changing your medicines (including medicines aimed at raising your blood pressure), and use of support stockings.  HOME CARE INSTRUCTIONS   · Drink enough fluids to keep your urine clear or pale yellow.  · Take your medicines as directed by your health care provider.  · Get up slowly from reclining or sitting positions. This gives your blood pressure a chance to adjust.  · Wear support stockings as directed by your health care provider.  · Maintain a healthy diet by including nutritious food, such as fruits, vegetables, nuts, whole grains, and lean meats.  SEEK MEDICAL CARE IF:  · You have vomiting or diarrhea.  · You have a fever for more than 2-3 days.  · You feel more thirsty than usual.  · You feel weak and  tired.  SEEK IMMEDIATE MEDICAL CARE IF:   · You have chest pain or a fast or irregular heartbeat.  · You have a loss of feeling in some part of your body, or you lose movement in your arms or legs.  · You have trouble speaking.  · You become sweaty or feel lightheaded.  · You faint.  MAKE SURE YOU:   · Understand these instructions.  · Will watch your condition.  · Will get help right away if you are not doing well or get worse.  Document Released: 09/17/2005 Document Revised: 07/08/2013 Document Reviewed: 03/20/2013  [employer]® Patient Information ©2015 [employer]. This information is not intended to replace advice given to you by your health care provider. Make sure you discuss any questions you have with your health care provider.

## 2014-06-02 NOTE — ED Notes (Signed)
Pt was working on roof in the heat started to feel faint then went to lay down. Stated that he has been having leg cramps and constipation for the last 2 days. EMS stated pt bp 81/44 and highest BP 90/50 manual. Administered 2 liters fluid. Pt did have 1 episode of vomiting with EMS. Denies any nausea at this time and stated that the only pain he has is needing to go to bathroom. Denies possibly taking extra bp medications. CBG 178  [id number] [id number]
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
## 2015-12-06 ENCOUNTER — Ambulatory Visit: Payer: No Typology Code available for payment source | Attending: Internal Medicine

## 2015-12-06 DIAGNOSIS — M25522 Pain in left elbow: Secondary | ICD-10-CM | POA: Insufficient documentation

## 2015-12-06 DIAGNOSIS — M25619 Stiffness of unspecified shoulder, not elsewhere classified: Secondary | ICD-10-CM | POA: Insufficient documentation

## 2015-12-06 DIAGNOSIS — M25561 Pain in right knee: Secondary | ICD-10-CM | POA: Insufficient documentation

## 2015-12-06 DIAGNOSIS — M25661 Stiffness of right knee, not elsewhere classified: Secondary | ICD-10-CM | POA: Insufficient documentation

## 2015-12-06 DIAGNOSIS — M25511 Pain in right shoulder: Secondary | ICD-10-CM | POA: Insufficient documentation

## 2015-12-06 DIAGNOSIS — M25512 Pain in left shoulder: Secondary | ICD-10-CM | POA: Insufficient documentation

## 2015-12-06 DIAGNOSIS — M25562 Pain in left knee: Secondary | ICD-10-CM | POA: Insufficient documentation

## 2015-12-21 ENCOUNTER — Ambulatory Visit: Payer: No Typology Code available for payment source

## 2015-12-21 DIAGNOSIS — M25661 Stiffness of right knee, not elsewhere classified: Secondary | ICD-10-CM

## 2015-12-21 DIAGNOSIS — M25511 Pain in right shoulder: Secondary | ICD-10-CM | POA: Diagnosis present

## 2015-12-21 DIAGNOSIS — M25512 Pain in left shoulder: Secondary | ICD-10-CM

## 2015-12-21 DIAGNOSIS — M25562 Pain in left knee: Secondary | ICD-10-CM

## 2015-12-21 DIAGNOSIS — M25522 Pain in left elbow: Secondary | ICD-10-CM | POA: Diagnosis present

## 2015-12-21 DIAGNOSIS — M25561 Pain in right knee: Secondary | ICD-10-CM | POA: Diagnosis present

## 2015-12-21 DIAGNOSIS — M25619 Stiffness of unspecified shoulder, not elsewhere classified: Secondary | ICD-10-CM

## 2015-12-21 NOTE — Patient Instructions (Signed)
Instructed with tactile and verbal cues wall slides both shoulders with scapula retraction 2x/day 3-5 reps.  And hamstring stretching a30 sec sitting or supine  Bilaterally 30 sec x 3 RT and LT and quad sets x10 5-10 sec hold 2x/day.
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
## 2015-12-27 ENCOUNTER — Ambulatory Visit: Payer: No Typology Code available for payment source | Admitting: Physical Therapy

## 2015-12-27 DIAGNOSIS — M25619 Stiffness of unspecified shoulder, not elsewhere classified: Secondary | ICD-10-CM

## 2015-12-27 DIAGNOSIS — M25511 Pain in right shoulder: Secondary | ICD-10-CM

## 2015-12-27 DIAGNOSIS — M25561 Pain in right knee: Secondary | ICD-10-CM

## 2015-12-27 DIAGNOSIS — M25562 Pain in left knee: Secondary | ICD-10-CM

## 2015-12-27 DIAGNOSIS — M25661 Stiffness of right knee, not elsewhere classified: Secondary | ICD-10-CM

## 2015-12-27 DIAGNOSIS — M25512 Pain in left shoulder: Secondary | ICD-10-CM

## 2015-12-27 DIAGNOSIS — M25522 Pain in left elbow: Secondary | ICD-10-CM
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
## 2015-12-29 ENCOUNTER — Ambulatory Visit: Payer: No Typology Code available for payment source | Admitting: Physical Therapy

## 2015-12-29 DIAGNOSIS — M25511 Pain in right shoulder: Secondary | ICD-10-CM

## 2015-12-29 DIAGNOSIS — M25512 Pain in left shoulder: Secondary | ICD-10-CM

## 2015-12-29 DIAGNOSIS — M25619 Stiffness of unspecified shoulder, not elsewhere classified: Secondary | ICD-10-CM | POA: Diagnosis not present

## 2015-12-29 DIAGNOSIS — M25562 Pain in left knee: Secondary | ICD-10-CM

## 2015-12-29 DIAGNOSIS — M25661 Stiffness of right knee, not elsewhere classified: Secondary | ICD-10-CM

## 2015-12-29 DIAGNOSIS — M25522 Pain in left elbow: Secondary | ICD-10-CM

## 2015-12-29 DIAGNOSIS — M25561 Pain in right knee: Secondary | ICD-10-CM

## 2015-12-29 NOTE — Patient Instructions (Signed)
Do Exericises Slowly Corey Carter  Over Head Pull: Narrow Grip       On back, knees bent, feet flat, band across thighs, elbows straight but relaxed. Pull hands apart (start). Keeping elbows straight, bring arms up and over head, hands toward floor. Keep pull steady on band. Hold momentarily. Return slowly, keeping pull steady, back to start. Repeat 2 x 10 ___ times. Band color ___red___   Side Pull: Double Arm   On back, knees bent, feet flat. Arms perpendicular to body, shoulder level, elbows straight but relaxed. Pull arms out to sides, elbows straight. Resistance band comes across collarbones, hands toward floor. Hold momentarily. Slowly return to starting position. Repeat _2 x 10__ times. Band color __red___   Corey Carter   On back, knees bent, feet flat, left hand on left hip, right hand above left. Pull right arm DIAGONALLY (hip to shoulder) across chest. Bring right arm along head toward floor. Hold momentarily. Slowly return to starting position. Repeat 2 x 10__ times. Do with left arm. Band color _red_____   Shoulder Rotation: Double Arm   On back, knees bent, feet flat, elbows tucked at sides, bent 90, hands palms up. Pull hands apart and down toward floor, keeping elbows near sides. Hold momentarily. Slowly return to starting position. Repeat  2 x 10___ times. Band color ___red___   Garen Corey Carter, PT 12/29/2015 10:29 AM Phone: [phone] Fax: [phone]
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
## 2016-01-03 ENCOUNTER — Ambulatory Visit: Payer: No Typology Code available for payment source | Attending: Internal Medicine | Admitting: Physical Therapy

## 2016-01-03 DIAGNOSIS — M25561 Pain in right knee: Secondary | ICD-10-CM

## 2016-01-03 DIAGNOSIS — M25612 Stiffness of left shoulder, not elsewhere classified: Secondary | ICD-10-CM | POA: Diagnosis present

## 2016-01-03 DIAGNOSIS — M25512 Pain in left shoulder: Secondary | ICD-10-CM | POA: Diagnosis present

## 2016-01-03 DIAGNOSIS — M6281 Muscle weakness (generalized): Secondary | ICD-10-CM | POA: Diagnosis present

## 2016-01-03 DIAGNOSIS — M25619 Stiffness of unspecified shoulder, not elsewhere classified: Secondary | ICD-10-CM | POA: Diagnosis present

## 2016-01-03 DIAGNOSIS — R262 Difficulty in walking, not elsewhere classified: Secondary | ICD-10-CM | POA: Diagnosis present

## 2016-01-03 DIAGNOSIS — M25661 Stiffness of right knee, not elsewhere classified: Secondary | ICD-10-CM

## 2016-01-03 DIAGNOSIS — M25611 Stiffness of right shoulder, not elsewhere classified: Secondary | ICD-10-CM | POA: Diagnosis present

## 2016-01-03 DIAGNOSIS — M25521 Pain in right elbow: Secondary | ICD-10-CM

## 2016-01-03 DIAGNOSIS — M25511 Pain in right shoulder: Secondary | ICD-10-CM

## 2016-01-03 DIAGNOSIS — M25522 Pain in left elbow: Secondary | ICD-10-CM | POA: Diagnosis present

## 2016-01-03 DIAGNOSIS — M25562 Pain in left knee: Secondary | ICD-10-CM | POA: Diagnosis present

## 2016-01-03 NOTE — Patient Instructions (Signed)
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
## 2016-01-05 ENCOUNTER — Ambulatory Visit: Payer: No Typology Code available for payment source

## 2016-01-05 DIAGNOSIS — M25561 Pain in right knee: Secondary | ICD-10-CM

## 2016-01-05 DIAGNOSIS — M25562 Pain in left knee: Secondary | ICD-10-CM

## 2016-01-05 DIAGNOSIS — M6281 Muscle weakness (generalized): Secondary | ICD-10-CM

## 2016-01-05 DIAGNOSIS — M25511 Pain in right shoulder: Secondary | ICD-10-CM

## 2016-01-05 DIAGNOSIS — M25521 Pain in right elbow: Secondary | ICD-10-CM

## 2016-01-05 DIAGNOSIS — M25611 Stiffness of right shoulder, not elsewhere classified: Secondary | ICD-10-CM | POA: Diagnosis not present

## 2016-01-05 DIAGNOSIS — R262 Difficulty in walking, not elsewhere classified: Secondary | ICD-10-CM
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
## 2016-01-05 NOTE — Patient Instructions (Signed)
Leg Extension (Hamstring)   Sit toward front edge of chair, with leg out straight, heel on floor, toes pointing toward body. Keeping back straight, bend forward at hip, breathing out through pursed lips. HOLD 30 secs.  Return, breathing in. Repeat _3__ times. Repeat with other leg. Do _3__ sessions per day. Variation: Perform from standing position, with support.   Quad Set    With other leg bent, foot flat, slowly tighten muscles on thigh of straight leg while counting out loud to __5__. Repeat with other leg. Repeat __10__ times. Do __3__ sessions per day.  [email or web address].exer.us/275   Copyright  VHI. All rights reserved.

## 2016-01-10 ENCOUNTER — Ambulatory Visit: Payer: No Typology Code available for payment source

## 2016-01-10 DIAGNOSIS — M25611 Stiffness of right shoulder, not elsewhere classified: Secondary | ICD-10-CM | POA: Diagnosis not present

## 2016-01-10 DIAGNOSIS — M25512 Pain in left shoulder: Secondary | ICD-10-CM

## 2016-01-10 DIAGNOSIS — M25511 Pain in right shoulder: Secondary | ICD-10-CM

## 2016-01-10 DIAGNOSIS — R262 Difficulty in walking, not elsewhere classified: Secondary | ICD-10-CM

## 2016-01-10 DIAGNOSIS — M25562 Pain in left knee: Secondary | ICD-10-CM

## 2016-01-10 DIAGNOSIS — M25521 Pain in right elbow: Secondary | ICD-10-CM

## 2016-01-10 DIAGNOSIS — M6281 Muscle weakness (generalized): Secondary | ICD-10-CM

## 2016-01-10 DIAGNOSIS — M25561 Pain in right knee: Secondary | ICD-10-CM
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
## 2016-01-11 ENCOUNTER — Ambulatory Visit: Payer: No Typology Code available for payment source

## 2016-01-11 DIAGNOSIS — M25521 Pain in right elbow: Secondary | ICD-10-CM

## 2016-01-11 DIAGNOSIS — R262 Difficulty in walking, not elsewhere classified: Secondary | ICD-10-CM

## 2016-01-11 DIAGNOSIS — M25561 Pain in right knee: Secondary | ICD-10-CM

## 2016-01-11 DIAGNOSIS — M25511 Pain in right shoulder: Secondary | ICD-10-CM

## 2016-01-11 DIAGNOSIS — M6281 Muscle weakness (generalized): Secondary | ICD-10-CM

## 2016-01-11 DIAGNOSIS — M25611 Stiffness of right shoulder, not elsewhere classified: Secondary | ICD-10-CM | POA: Diagnosis not present

## 2016-01-11 DIAGNOSIS — M25562 Pain in left knee: Secondary | ICD-10-CM
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
## 2016-01-12 ENCOUNTER — Encounter: Payer: Medicare Other | Admitting: Physical Therapy

## 2016-01-17 ENCOUNTER — Ambulatory Visit: Payer: No Typology Code available for payment source | Admitting: Physical Therapy

## 2016-01-17 DIAGNOSIS — M25611 Stiffness of right shoulder, not elsewhere classified: Secondary | ICD-10-CM

## 2016-01-17 DIAGNOSIS — M25562 Pain in left knee: Secondary | ICD-10-CM

## 2016-01-17 DIAGNOSIS — M25522 Pain in left elbow: Secondary | ICD-10-CM

## 2016-01-17 DIAGNOSIS — M25521 Pain in right elbow: Secondary | ICD-10-CM

## 2016-01-17 DIAGNOSIS — M25512 Pain in left shoulder: Secondary | ICD-10-CM

## 2016-01-17 DIAGNOSIS — R262 Difficulty in walking, not elsewhere classified: Secondary | ICD-10-CM

## 2016-01-17 DIAGNOSIS — M25619 Stiffness of unspecified shoulder, not elsewhere classified: Secondary | ICD-10-CM

## 2016-01-17 DIAGNOSIS — M25561 Pain in right knee: Secondary | ICD-10-CM

## 2016-01-17 DIAGNOSIS — M25661 Stiffness of right knee, not elsewhere classified: Secondary | ICD-10-CM

## 2016-01-17 DIAGNOSIS — M6281 Muscle weakness (generalized): Secondary | ICD-10-CM

## 2016-01-17 DIAGNOSIS — M25511 Pain in right shoulder: Secondary | ICD-10-CM
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
## 2016-01-19 ENCOUNTER — Ambulatory Visit: Payer: No Typology Code available for payment source | Admitting: Physical Therapy

## 2016-01-19 DIAGNOSIS — M25611 Stiffness of right shoulder, not elsewhere classified: Secondary | ICD-10-CM | POA: Diagnosis not present

## 2016-01-19 DIAGNOSIS — M25511 Pain in right shoulder: Secondary | ICD-10-CM

## 2016-01-19 DIAGNOSIS — M25661 Stiffness of right knee, not elsewhere classified: Secondary | ICD-10-CM

## 2016-01-19 DIAGNOSIS — M25512 Pain in left shoulder: Secondary | ICD-10-CM

## 2016-01-19 DIAGNOSIS — M25561 Pain in right knee: Secondary | ICD-10-CM

## 2016-01-19 DIAGNOSIS — M6281 Muscle weakness (generalized): Secondary | ICD-10-CM

## 2016-01-19 NOTE — Patient Instructions (Signed)
Trigger Point Dry Needling  . What is Trigger Point Dry Needling (DN)? o DN is a physical therapy technique used to treat muscle pain and dysfunction. Specifically, DN helps deactivate muscle trigger points (muscle knots).  o A thin filiform needle is used to penetrate the skin and stimulate the underlying trigger point. The goal is for a local twitch response (LTR) to occur and for the trigger point to relax. No medication of any kind is injected during the procedure.   . What Does Trigger Point Dry Needling Feel Like?  o The procedure feels different for each individual patient. Some patients report that they do not actually feel the needle enter the skin and overall the process is not painful. Very mild bleeding may occur. However, many patients feel a deep cramping in the muscle in which the needle was inserted. This is the local twitch response.   Marland Kitchen. How Will I feel after the treatment? o Soreness is normal, and the onset of soreness may not occur for a few hours. Typically this soreness does not last longer than two days.  o Bruising is uncommon, however; ice can be used to decrease any possible bruising.  o In rare cases feeling tired or nauseous after the treatment is normal. In addition, your symptoms may get worse before they get better, this period will typically not last longer than 24 hours.   . What Can I do After My Treatment? o Increase your hydration by drinking more water for the next 24 hours. o You may place ice or heat on the areas treated that have become sore, however, do not use heat on inflamed or bruised areas. Heat often brings more relief post needling. o You can continue your regular activities, but vigorous activity is not recommended initially after the treatment for 24 hours. o DN is best combined with other physical therapy such as strengthening, stretching, and other therapies.  Garen LahLawrie Maimouna Rondeau, PT 01/19/2016 9:22 AM Phone: [phone] Fax: [phone]
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
## 2016-01-24 ENCOUNTER — Encounter: Payer: Medicare Other | Admitting: Physical Therapy

## 2016-01-26 ENCOUNTER — Ambulatory Visit: Payer: No Typology Code available for payment source | Admitting: Physical Therapy

## 2016-01-26 DIAGNOSIS — M25511 Pain in right shoulder: Secondary | ICD-10-CM

## 2016-01-26 DIAGNOSIS — M25611 Stiffness of right shoulder, not elsewhere classified: Secondary | ICD-10-CM

## 2016-01-26 DIAGNOSIS — M6281 Muscle weakness (generalized): Secondary | ICD-10-CM

## 2016-01-26 DIAGNOSIS — M25512 Pain in left shoulder: Secondary | ICD-10-CM

## 2016-01-26 DIAGNOSIS — M25661 Stiffness of right knee, not elsewhere classified: Secondary | ICD-10-CM

## 2016-01-26 DIAGNOSIS — M25612 Stiffness of left shoulder, not elsewhere classified: Secondary | ICD-10-CM

## 2016-01-26 DIAGNOSIS — M25561 Pain in right knee: Secondary | ICD-10-CM
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
## 2016-01-31 ENCOUNTER — Encounter: Payer: Medicare Other | Admitting: Physical Therapy

## 2016-02-07 ENCOUNTER — Ambulatory Visit: Payer: No Typology Code available for payment source | Attending: Internal Medicine | Admitting: Physical Therapy

## 2016-02-07 DIAGNOSIS — M25612 Stiffness of left shoulder, not elsewhere classified: Secondary | ICD-10-CM | POA: Diagnosis present

## 2016-02-07 DIAGNOSIS — M25561 Pain in right knee: Secondary | ICD-10-CM | POA: Diagnosis present

## 2016-02-07 DIAGNOSIS — M25511 Pain in right shoulder: Secondary | ICD-10-CM | POA: Insufficient documentation

## 2016-02-07 DIAGNOSIS — M25661 Stiffness of right knee, not elsewhere classified: Secondary | ICD-10-CM | POA: Insufficient documentation

## 2016-02-07 DIAGNOSIS — M6281 Muscle weakness (generalized): Secondary | ICD-10-CM | POA: Insufficient documentation

## 2016-02-07 DIAGNOSIS — M25512 Pain in left shoulder: Secondary | ICD-10-CM | POA: Insufficient documentation

## 2016-02-07 DIAGNOSIS — M25611 Stiffness of right shoulder, not elsewhere classified: Secondary | ICD-10-CM | POA: Diagnosis present

## 2016-02-07 NOTE — Patient Instructions (Signed)
Pt given surgical tubing handout for ant/middle and posterior deltoid.   Knee High   Holding stable object, raise knee to hip level, then lower knee. Repeat with other knee. Complete __10_ repetitions. Do __2__ sessions per day.  ABDUCTION: Standing (Active)   Stand, feet flat. Lift right leg out to side. Use _0__ lbs. Complete __10_ repetitions. Perform __2_ sessions per day.  ADDUCTION: Standing - Stable (Active)   Stand, right leg out to side as far as possible. Draw leg in across midline. Use _0__ lbs. Complete 10_ repetitions. Perform _2__ sessions per day.       EXTENSION: Standing (Active)  Stand, both feet flat. Draw right leg behind body as far as possible. Use 0___ lbs. Complete 10 repetitions. Perform __2_ sessions per day.  Copyright  VHI. All rights reserved.   May add 2 lb to 5 lb to and progress until 15 lb max.  Can get at  [employer].  Garen LahLawrie Beardsley, PT 02/07/2016 8:35 AM Phone: [phone] Fax: [phone]
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
## 2016-02-09 ENCOUNTER — Ambulatory Visit: Payer: No Typology Code available for payment source | Admitting: Physical Therapy

## 2016-02-14 ENCOUNTER — Encounter: Payer: Medicare Other | Admitting: Physical Therapy

## 2016-02-16 ENCOUNTER — Encounter: Payer: Medicare Other | Admitting: Physical Therapy

## 2016-03-15 ENCOUNTER — Encounter (HOSPITAL_COMMUNITY): Payer: Self-pay | Admitting: *Deleted

## 2016-03-15 ENCOUNTER — Other Ambulatory Visit: Payer: Self-pay

## 2016-03-15 ENCOUNTER — Emergency Department (HOSPITAL_COMMUNITY)
Admission: EM | Admit: 2016-03-15 | Discharge: 2016-03-15 | Disposition: A | Discharge status: Home / Self Care | Payer: Medicare Other | Attending: Emergency Medicine | Admitting: Emergency Medicine

## 2016-03-15 DIAGNOSIS — Z7982 Long term (current) use of aspirin: Secondary | ICD-10-CM | POA: Diagnosis not present

## 2016-03-15 DIAGNOSIS — R112 Nausea with vomiting, unspecified: Secondary | ICD-10-CM | POA: Insufficient documentation

## 2016-03-15 DIAGNOSIS — Z5181 Encounter for therapeutic drug level monitoring: Secondary | ICD-10-CM | POA: Diagnosis not present

## 2016-03-15 DIAGNOSIS — Z87891 Personal history of nicotine dependence: Secondary | ICD-10-CM | POA: Insufficient documentation

## 2016-03-15 DIAGNOSIS — Z79899 Other long term (current) drug therapy: Secondary | ICD-10-CM | POA: Diagnosis not present

## 2016-03-15 DIAGNOSIS — I10 Essential (primary) hypertension: Secondary | ICD-10-CM | POA: Diagnosis not present

## 2016-03-15 LAB — COMPREHENSIVE METABOLIC PANEL
ALBUMIN: 3.8 g/dL (ref 3.5–5.0)
ALT: 16 U/L — ABNORMAL LOW (ref 17–63)
ANION GAP: 7 (ref 5–15)
AST: 24 U/L (ref 15–41)
Alkaline Phosphatase: 52 U/L (ref 38–126)
BUN: 23 mg/dL — ABNORMAL HIGH (ref 6–20)
CHLORIDE: 101 mmol/L (ref 101–111)
CO2: 26 mmol/L (ref 22–32)
Calcium: 8.5 mg/dL — ABNORMAL LOW (ref 8.9–10.3)
Creatinine, Ser: 0.79 mg/dL (ref 0.61–1.24)
GFR calc Af Amer: >60 mL/min (ref >60)
GFR calc non Af Amer: >60 mL/min (ref >60)
GLUCOSE: 99 mg/dL (ref 65–99)
POTASSIUM: 3.3 mmol/L — AB (ref 3.5–5.1)
SODIUM: 134 mmol/L — AB (ref 135–145)
TOTAL PROTEIN: 6.6 g/dL (ref 6.5–8.1)
Total Bilirubin: 0.8 mg/dL (ref 0.3–1.2)

## 2016-03-15 LAB — URINALYSIS, ROUTINE W REFLEX MICROSCOPIC
Bilirubin Urine: NEGATIVE
Glucose, UA: NEGATIVE mg/dL
HGB URINE DIPSTICK: NEGATIVE
Ketones, ur: NEGATIVE mg/dL
LEUKOCYTES UA: NEGATIVE
Nitrite: NEGATIVE
PROTEIN: NEGATIVE mg/dL
Specific Gravity, Urine: 1.031 — ABNORMAL HIGH (ref 1.005–1.030)
pH: 6 (ref 5.0–8.0)

## 2016-03-15 LAB — CBC
HCT: 33.4 % — ABNORMAL LOW (ref 39.0–52.0)
Hemoglobin: 11.2 g/dL — ABNORMAL LOW (ref 13.0–17.0)
MCH: 32.9 pg (ref 26.0–34.0)
MCHC: 33.5 g/dL (ref 30.0–36.0)
MCV: 98.2 fL (ref 78.0–100.0)
PLATELETS: 293 10*3/uL (ref 150–400)
RBC: 3.4 MIL/uL — ABNORMAL LOW (ref 4.22–5.81)
RDW: 13.1 % (ref 11.5–15.5)
WBC: 7.3 10*3/uL (ref 4.0–10.5)

## 2016-03-15 LAB — LIPASE, BLOOD: Lipase: 22 U/L (ref 11–51)

## 2016-03-15 LAB — PROTIME-INR
INR: 1.18 (ref 0.00–1.49)
Prothrombin Time: 14.7 seconds (ref 11.6–15.2)

## 2016-03-15 LAB — TROPONIN I

## 2016-03-15 LAB — MAGNESIUM: MAGNESIUM: 1.9 mg/dL (ref 1.7–2.4)

## 2016-03-15 MED ORDER — SODIUM CHLORIDE 0.9 % IV SOLN
INTRAVENOUS | Status: DC
  Administered 2016-03-15: 1000 mL via INTRAVENOUS

## 2016-03-15 MED ORDER — ONDANSETRON HCL 4 MG PO TABS: 4.0000 mg | ORAL_TABLET | Freq: Four times a day (QID) | ORAL | Status: DC

## 2016-03-15 MED ORDER — SODIUM CHLORIDE 0.9 % IV BOLUS (SEPSIS)
1000.0000 mL | Freq: Once | INTRAVENOUS | Status: AC
Start: 2016-03-15 — End: 2016-03-15
  Administered 2016-03-15: 1000 mL via INTRAVENOUS

## 2016-03-15 NOTE — ED Notes (Signed)
Per [facility], pt stated he had worked outside all day yesterday just like every day, c/o n/v.  Pt stated he vomited x 3.  Also stated he was bitten by a tick last week.  Wife stated he c/o being cold all night.

## 2016-03-15 NOTE — ED Notes (Signed)
Pt stated "I've been having right shoulder pain & been seeing a doctor for that.  It's been going on for months.  I've been to therapy & had injections in my shoulder but it's been hurting more down in my arm."  Pt indicates right upper arm.

## 2016-03-15 NOTE — ED Notes (Signed)
Pt was also given zofran 4 mg IV & NS 250 mL PTA.

## 2016-03-15 NOTE — ED Notes (Signed)
Bed: [id number] Expected date:  Expected time:  Means of arrival:  Comments: 45M/n/v/?heat exhaustion
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
## 2016-03-15 NOTE — Discharge Instructions (Signed)
Nausea and Vomiting  Nausea means you feel sick to your stomach. Throwing up (vomiting) is a reflex where stomach contents come out of your mouth.  HOME CARE   · Take medicine as told by your doctor.  · Do not force yourself to eat. However, you do need to drink fluids.  · If you feel like eating, eat a normal diet as told by your doctor.    Eat rice, wheat, potatoes, bread, lean meats, yogurt, fruits, and vegetables.    Avoid high-fat foods.  · Drink enough fluids to keep your pee (urine) clear or pale yellow.  · Ask your doctor how to replace body fluid losses (rehydrate). Signs of body fluid loss (dehydration) include:    Feeling very thirsty.    Dry lips and mouth.    Feeling dizzy.    Dark pee.    Peeing less than normal.    Feeling confused.    Fast breathing or heart rate.  GET HELP RIGHT AWAY IF:   · You have blood in your throw up.  · You have black or bloody poop (stool).  · You have a bad headache or stiff neck.  · You feel confused.  · You have bad belly (abdominal) pain.  · You have chest pain or trouble breathing.  · You do not pee at least once every 8 hours.  · You have cold, clammy skin.  · You keep throwing up after 24 to 48 hours.  · You have a fever.  MAKE SURE YOU:   · Understand these instructions.  · Will watch your condition.  · Will get help right away if you are not doing well or get worse.     This information is not intended to replace advice given to you by your health care provider. Make sure you discuss any questions you have with your health care provider.     Document Released: 03/05/2008 Document Revised: 12/10/2011 Document Reviewed: 02/16/2011  Elsevier Interactive Patient Education ©2016 [employer]vier Inc.

## 2017-03-25 ENCOUNTER — Ambulatory Visit (INDEPENDENT_AMBULATORY_CARE_PROVIDER_SITE_OTHER): Payer: Medicare Other | Admitting: Orthopaedic Surgery

## 2017-03-25 ENCOUNTER — Ambulatory Visit (INDEPENDENT_AMBULATORY_CARE_PROVIDER_SITE_OTHER): Payer: Medicare Other

## 2017-03-25 ENCOUNTER — Encounter (INDEPENDENT_AMBULATORY_CARE_PROVIDER_SITE_OTHER): Payer: Self-pay | Admitting: Orthopaedic Surgery

## 2017-03-25 DIAGNOSIS — M1711 Unilateral primary osteoarthritis, right knee: Secondary | ICD-10-CM | POA: Diagnosis not present
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
## 2017-04-05 ENCOUNTER — Other Ambulatory Visit (INDEPENDENT_AMBULATORY_CARE_PROVIDER_SITE_OTHER): Payer: Self-pay | Admitting: Orthopaedic Surgery

## 2017-04-05 DIAGNOSIS — M1711 Unilateral primary osteoarthritis, right knee: Secondary | ICD-10-CM

## 2017-04-08 ENCOUNTER — Other Ambulatory Visit (INDEPENDENT_AMBULATORY_CARE_PROVIDER_SITE_OTHER): Payer: Self-pay | Admitting: Orthopaedic Surgery

## 2017-04-09 ENCOUNTER — Other Ambulatory Visit (HOSPITAL_COMMUNITY): Payer: Self-pay | Admitting: *Deleted

## 2017-04-09 ENCOUNTER — Encounter (HOSPITAL_COMMUNITY)
Admission: RE | Admit: 2017-04-09 | Discharge: 2017-04-09 | Disposition: A | Discharge status: Home / Self Care | Payer: Medicare Other | Source: Ambulatory Visit | Attending: Orthopaedic Surgery | Admitting: Orthopaedic Surgery

## 2017-04-09 ENCOUNTER — Encounter (HOSPITAL_COMMUNITY): Payer: Self-pay

## 2017-04-09 DIAGNOSIS — R9431 Abnormal electrocardiogram [ECG] [EKG]: Secondary | ICD-10-CM | POA: Diagnosis not present

## 2017-04-09 DIAGNOSIS — G4730 Sleep apnea, unspecified: Secondary | ICD-10-CM | POA: Diagnosis not present

## 2017-04-09 DIAGNOSIS — Z87442 Personal history of urinary calculi: Secondary | ICD-10-CM | POA: Diagnosis not present

## 2017-04-09 DIAGNOSIS — Z01810 Encounter for preprocedural cardiovascular examination: Secondary | ICD-10-CM | POA: Insufficient documentation

## 2017-04-09 DIAGNOSIS — K219 Gastro-esophageal reflux disease without esophagitis: Secondary | ICD-10-CM | POA: Diagnosis not present

## 2017-04-09 DIAGNOSIS — G510 Bell's palsy: Secondary | ICD-10-CM | POA: Diagnosis not present

## 2017-04-09 DIAGNOSIS — Z8701 Personal history of pneumonia (recurrent): Secondary | ICD-10-CM | POA: Insufficient documentation

## 2017-04-09 DIAGNOSIS — M1711 Unilateral primary osteoarthritis, right knee: Secondary | ICD-10-CM | POA: Diagnosis not present

## 2017-04-09 DIAGNOSIS — I10 Essential (primary) hypertension: Secondary | ICD-10-CM | POA: Insufficient documentation

## 2017-04-09 DIAGNOSIS — Z01812 Encounter for preprocedural laboratory examination: Secondary | ICD-10-CM | POA: Diagnosis not present

## 2017-04-09 HISTORY — DX: Personal history of urinary calculi: Z87.442

## 2017-04-09 LAB — COMPREHENSIVE METABOLIC PANEL
ALK PHOS: 58 U/L (ref 38–126)
ALT: 16 U/L — ABNORMAL LOW (ref 17–63)
ANION GAP: 7 (ref 5–15)
AST: 23 U/L (ref 15–41)
Albumin: 3.9 g/dL (ref 3.5–5.0)
BILIRUBIN TOTAL: 0.7 mg/dL (ref 0.3–1.2)
BUN: 17 mg/dL (ref 6–20)
CALCIUM: 8.8 mg/dL — AB (ref 8.9–10.3)
CO2: 27 mmol/L (ref 22–32)
Chloride: 101 mmol/L (ref 101–111)
Creatinine, Ser: 1.07 mg/dL (ref 0.61–1.24)
GFR calc Af Amer: >60 mL/min (ref >60)
GLUCOSE: 111 mg/dL — AB (ref 65–99)
POTASSIUM: 3.9 mmol/L (ref 3.5–5.1)
Sodium: 135 mmol/L (ref 135–145)
TOTAL PROTEIN: 6.6 g/dL (ref 6.5–8.1)

## 2017-04-09 LAB — URINALYSIS, ROUTINE W REFLEX MICROSCOPIC
BILIRUBIN URINE: NEGATIVE
Glucose, UA: NEGATIVE mg/dL
Hgb urine dipstick: NEGATIVE
Ketones, ur: NEGATIVE mg/dL
Leukocytes, UA: NEGATIVE
NITRITE: NEGATIVE
PH: 6 (ref 5.0–8.0)
Protein, ur: NEGATIVE mg/dL
SPECIFIC GRAVITY, URINE: 1.014 (ref 1.005–1.030)

## 2017-04-09 LAB — SURGICAL PCR SCREEN
MRSA, PCR: NEGATIVE
Staphylococcus aureus: NEGATIVE

## 2017-04-09 LAB — C-REACTIVE PROTEIN: CRP: <0.8 mg/dL (ref <1.0)

## 2017-04-09 LAB — CBC WITH DIFFERENTIAL/PLATELET
Basophils Absolute: 0.1 10*3/uL (ref 0.0–0.1)
Basophils Relative: 1 %
Eosinophils Absolute: 0.5 10*3/uL (ref 0.0–0.7)
Eosinophils Relative: 6 %
HEMATOCRIT: 39.5 % (ref 39.0–52.0)
HEMOGLOBIN: 12.9 g/dL — AB (ref 13.0–17.0)
LYMPHS ABS: 1.9 10*3/uL (ref 0.7–4.0)
LYMPHS PCT: 23 %
MCH: 32.7 pg (ref 26.0–34.0)
MCHC: 32.7 g/dL (ref 30.0–36.0)
MCV: 100.3 fL — AB (ref 78.0–100.0)
MONO ABS: 1 10*3/uL (ref 0.1–1.0)
MONOS PCT: 12 %
NEUTROS ABS: 4.7 10*3/uL (ref 1.7–7.7)
NEUTROS PCT: 58 %
Platelets: 275 10*3/uL (ref 150–400)
RBC: 3.94 MIL/uL — ABNORMAL LOW (ref 4.22–5.81)
RDW: 12.3 % (ref 11.5–15.5)
WBC: 8.2 10*3/uL (ref 4.0–10.5)

## 2017-04-09 LAB — TYPE AND SCREEN
ABO/RH(D): O POS
Antibody Screen: NEGATIVE

## 2017-04-09 LAB — APTT: aPTT: 31 seconds (ref 24–36)

## 2017-04-09 LAB — PROTIME-INR
INR: 1
PROTHROMBIN TIME: 13.2 s (ref 11.4–15.2)

## 2017-04-09 LAB — SEDIMENTATION RATE: Sed Rate: 14 mm/hr (ref 0–16)

## 2017-04-09 LAB — ABO/RH: ABO/RH(D): O POS

## 2017-04-09 NOTE — Pre-Procedure Instructions (Signed)
Corey SitesRoger Carter  04/09/2017    Your procedure is scheduled on Wednesday, April 17, 2017 at 8:30 AM.   [facility] En[facility]A" Admitting Office at 6:30 AM.   Call this number if you have problems the morning of surgery: [phone]   Questions prior to day of surgery, please call [phone] between 8 & 4 PM.   Remember:  Do not eat food or drink liquids after midnight Tuesday, 04/16/17.  Take these medicines the morning of surgery with A SIP OF WATER: Ranitidine (Zantac), Hydrocodone - if needed  Stop Aspirin, Multivitamins and Herbal products 5 days prior to surgery. Do not use NSAIDS (Ibuprofen, Aleve, etc) 5 days prior to surgery.   Do not wear jewelry.  Do not wear lotions, powders, cologne or deodorant.  Men may shave face and neck.  Do not bring valuables to the hospital.  [facility] is not responsible for any belongings or valuables.  Contacts, dentures or bridgework may not be worn into surgery.  Leave your suitcase in the car.  After surgery it may be brought to your room.  For patients admitted to the hospital, discharge time will be determined by your treatment team.  Special instructions:  [place] - Preparing for Surgery  Before surgery, you can play an important role.  Because skin is not sterile, your skin needs to be as free of germs as possible.  You can reduce the number of germs on you skin by washing with CHG (chlorahexidine gluconate) soap before surgery.  CHG is an antiseptic cleaner which kills germs and bonds with the skin to continue killing germs even after washing.  Please DO NOT use if you have an allergy to CHG or antibacterial soaps.  If your skin becomes reddened/irritated stop using the CHG and inform your nurse when you arrive at Short Stay.  Do not shave (including legs and underarms) for at least 48 hours prior to the first CHG shower.  You may shave your face.  Please follow these instructions carefully:   1.  Shower with  CHG Soap the night before surgery and the                    morning of Surgery.  2.  If you choose to wash your hair, wash your hair first as usual with your       normal shampoo.  3.  After you shampoo, rinse your hair and body thoroughly to remove the shampoo.  4.  Use CHG as you would any other liquid soap.  You can apply chg directly       to the skin and wash gently with scrungie or a clean washcloth.  5.  Apply the CHG Soap to your body ONLY FROM THE NECK DOWN.        Do not use on open wounds or open sores.  Avoid contact with your eyes, ears, mouth and genitals (private parts).  Wash genitals (private parts) with your normal soap.  6.  Wash thoroughly, paying special attention to the area where your surgery        will be performed.  7.  Thoroughly rinse your body with warm water from the neck down.  8.  DO NOT shower/wash with your normal soap after using and rinsing off       the CHG Soap.  9.  Pat yourself dry with a clean towel.  10.  Wear clean pajamas.            11.  Place clean sheets on your bed the night of your first shower and do not        sleep with pets.  Day of Surgery  Do not apply any lotions/deodorants the morning of surgery.  Please wear clean clothes to the hospital.   Please read over the fact sheets that you were given.

## 2017-04-09 NOTE — Pre-Procedure Instructions (Addendum)
  Corey Carter  04/09/2017    Your procedure is scheduled on Wednesday, April 17, 2017 at 8:30 AM.   Report to [facility] Entrance "A" Admitting Office at 6:30 AM.   Call this number if you have problems the morning of surgery: [phone]   Questions prior to day of surgery, please call [phone] between 8 & 4 PM.   Remember:  Do not eat food or drink liquids after midnight Tuesday, 04/16/17.  Take these medicines the morning of surgery with A SIP OF WATER: Ranitidine (Zantac), Hydrocodone - if needed  Stop Aspirin, Multivitamins and Herbal products 5 days prior to surgery. Do not use NSAIDS (Ibuprofen, Aleve, etc) 5 days prior to surgery.   Do not wear jewelry.  Do not wear lotions, powders, cologne or deodorant.  Men may shave face and neck.  Do not bring valuables to the hospital.  [facility] is not responsible for any belongings or valuables.  Contacts, dentures or bridgework may not be worn into surgery.  Leave your suitcase in the car.  After surgery it may be brought to your room.  For patients admitted to the hospital, discharge time will be determined by your treatment team.  Special instructions:  [facility] - Preparing for Surgery  Before surgery, you can play an important role.  Because skin is not sterile, your skin needs to be as free of germs as possible.  You can reduce the number of germs on you skin by washing with CHG (chlorahexidine gluconate) soap before surgery.  CHG is an antiseptic cleaner which kills germs and bonds with the skin to continue killing germs even after washing.  Please DO NOT use if you have an allergy to CHG or antibacterial soaps.  If your skin becomes reddened/irritated stop using the CHG and inform your nurse when you arrive at Short Stay.  Do not shave (including legs and underarms) for at least 48 hours prior to the first CHG shower.  You may shave your face.  Please follow these instructions carefully:   1.  Shower with  CHG Soap the night before surgery and the                    morning of Surgery.  2.  If you choose to wash your hair, wash your hair first as usual with your       normal shampoo.  3.  After you shampoo, rinse your hair and body thoroughly to remove the shampoo.  4.  Use CHG as you would any other liquid soap.  You can apply chg directly       to the skin and wash gently with scrungie or a clean washcloth.  5.  Apply the CHG Soap to your body ONLY FROM THE NECK DOWN.        Do not use on open wounds or open sores.  Avoid contact with your eyes, ears, mouth and genitals (private parts).  Wash genitals (private parts) with your normal soap.  6.  Wash thoroughly, paying special attention to the area where your surgery        will be performed.  7.  Thoroughly rinse your body with warm water from the neck down.  8.  DO NOT shower/wash with your normal soap after using and rinsing off       the CHG Soap.  9.  Pat yourself dry with a clean towel.              10.  Wear clean pajamas.            11.  Place clean sheets on your bed the night of your first shower and do not        sleep with pets.  Day of Surgery  Do not apply any lotions/deodorants the morning of surgery.  Please wear clean clothes to the hospital.   Please read over the fact sheets that you were given.

## 2017-04-10 NOTE — Progress Notes (Signed)
Anesthesia Chart Review:  Pt is a 71 year old male scheduled for R total knee arthroplasty on 04/17/2017 with Gershon MusselNaiping Xu, MD  - PCP is Windle GuardWilson Elkins, MD  PMH includes:  HTN, OSA, GERD. Former smoker. BMI 26.  Medications include: ASA 81 mg, HCTZ, lisinopril, Zantac  Preoperative labs reviewed.  EKG 04/09/17: Sinus rhythm with premature supraventricular complexes. LAD. Low voltage QRS. Inferior infarct, age undetermined.  No significant change since EKG 06/02/14  If no changes, I anticipate pt can proceed with surgery as scheduled.   Rica Mastngela Jovanna Hodges, FNP-BC [facility]Anesthesiology Phone: [phone] 04/10/2017 1:18 PM

## 2017-04-11 ENCOUNTER — Telehealth (INDEPENDENT_AMBULATORY_CARE_PROVIDER_SITE_OTHER): Payer: Self-pay

## 2017-04-11 NOTE — Telephone Encounter (Signed)
Patient wife called stating that a Rx was to be prescribed for  Inflammation at patient last office visit.  Stated that patient has not received a Rx at the pharmacy.  Would like for Rx to be sent to [facility].  CB# [phone].  Please advise.

## 2017-04-11 NOTE — Telephone Encounter (Signed)
He can't have antiinflammatory meds at this point because he has to stop taking them 7 days before surgery

## 2017-04-11 NOTE — Telephone Encounter (Signed)
Which Rx were you suppose to send in ?

## 2017-04-11 NOTE — Telephone Encounter (Signed)
LMOM

## 2017-04-16 MED ORDER — CLINDAMYCIN PHOSPHATE 900 MG/50ML IV SOLN
900.0000 mg | INTRAVENOUS | Status: AC
  Administered 2017-04-17: 900 mg via INTRAVENOUS
  Filled 2017-04-16: qty 50

## 2017-04-16 MED ORDER — BUPIVACAINE LIPOSOME 1.3 % IJ SUSP
20.0000 mL | INTRAMUSCULAR | Status: AC
  Administered 2017-04-17: 20 mL
  Filled 2017-04-16: qty 20

## 2017-04-16 MED ORDER — TRANEXAMIC ACID 1000 MG/10ML IV SOLN
1000.0000 mg | INTRAVENOUS | Status: AC
  Administered 2017-04-17: 1000 mg via INTRAVENOUS
  Filled 2017-04-16: qty 10

## 2017-04-17 ENCOUNTER — Ambulatory Visit (HOSPITAL_COMMUNITY): Payer: Medicare Other | Admitting: Anesthesiology

## 2017-04-17 ENCOUNTER — Inpatient Hospital Stay (HOSPITAL_COMMUNITY): Payer: Medicare Other

## 2017-04-17 ENCOUNTER — Encounter (HOSPITAL_COMMUNITY)
Admission: RE | Disposition: A | Discharge status: Home / Self Care | Payer: Self-pay | Source: Ambulatory Visit | Attending: Orthopaedic Surgery

## 2017-04-17 ENCOUNTER — Inpatient Hospital Stay (HOSPITAL_COMMUNITY)
Admission: RE | Admit: 2017-04-17 | Discharge: 2017-04-19 | DRG: 470 | Disposition: A | Discharge status: Home / Self Care | Payer: Medicare Other | Source: Ambulatory Visit | Attending: Orthopaedic Surgery | Admitting: Orthopaedic Surgery

## 2017-04-17 ENCOUNTER — Encounter (HOSPITAL_COMMUNITY): Payer: Self-pay | Admitting: Urology

## 2017-04-17 ENCOUNTER — Ambulatory Visit (HOSPITAL_COMMUNITY): Payer: Medicare Other | Admitting: Emergency Medicine

## 2017-04-17 DIAGNOSIS — Z7982 Long term (current) use of aspirin: Secondary | ICD-10-CM | POA: Diagnosis not present

## 2017-04-17 DIAGNOSIS — I10 Essential (primary) hypertension: Secondary | ICD-10-CM | POA: Diagnosis present

## 2017-04-17 DIAGNOSIS — M1711 Unilateral primary osteoarthritis, right knee: Secondary | ICD-10-CM | POA: Diagnosis not present

## 2017-04-17 DIAGNOSIS — Z880 Allergy status to penicillin: Secondary | ICD-10-CM | POA: Diagnosis not present

## 2017-04-17 DIAGNOSIS — D62 Acute posthemorrhagic anemia: Secondary | ICD-10-CM | POA: Diagnosis not present

## 2017-04-17 DIAGNOSIS — Z96651 Presence of right artificial knee joint: Secondary | ICD-10-CM

## 2017-04-17 DIAGNOSIS — G4730 Sleep apnea, unspecified: Secondary | ICD-10-CM | POA: Diagnosis present

## 2017-04-17 DIAGNOSIS — Z885 Allergy status to narcotic agent status: Secondary | ICD-10-CM | POA: Diagnosis not present

## 2017-04-17 DIAGNOSIS — Z79899 Other long term (current) drug therapy: Secondary | ICD-10-CM | POA: Diagnosis not present

## 2017-04-17 DIAGNOSIS — Z96659 Presence of unspecified artificial knee joint: Secondary | ICD-10-CM

## 2017-04-17 DIAGNOSIS — Z87891 Personal history of nicotine dependence: Secondary | ICD-10-CM

## 2017-04-17 HISTORY — PX: TOTAL KNEE ARTHROPLASTY: SHX125

## 2017-04-17 HISTORY — DX: Personal history of other diseases of the digestive system: Z87.19

## 2017-04-17 HISTORY — DX: Dorsalgia, unspecified: M54.9

## 2017-04-17 HISTORY — DX: Other chronic pain: G89.29

## 2017-04-17 HISTORY — DX: Personal history of peptic ulcer disease: Z87.11

## 2017-04-17 SURGERY — ARTHROPLASTY, KNEE, TOTAL
Anesthesia: Monitor Anesthesia Care | Site: Knee | Laterality: Right

## 2017-04-17 MED ORDER — CHLORHEXIDINE GLUCONATE 4 % EX LIQD
60.0000 mL | Freq: Once | CUTANEOUS | Status: DC
Start: 2017-04-17 — End: 2017-04-17

## 2017-04-17 MED ORDER — ACETAMINOPHEN 650 MG RE SUPP: 650.0000 mg | Freq: Four times a day (QID) | RECTAL | Status: DC | PRN

## 2017-04-17 MED ORDER — LISINOPRIL 10 MG PO TABS
10.0000 mg | ORAL_TABLET | Freq: Every day | ORAL | Status: DC
  Administered 2017-04-19: 10 mg via ORAL
  Filled 2017-04-17 (×2): qty 1

## 2017-04-17 MED ORDER — HYDROMORPHONE HCL 1 MG/ML IJ SOLN
INTRAMUSCULAR | Status: AC
  Administered 2017-04-17: 0.5 mg via INTRAVENOUS
  Filled 2017-04-17: qty 0.5

## 2017-04-17 MED ORDER — LACTATED RINGERS IV SOLN
INTRAVENOUS | Status: DC | PRN
  Administered 2017-04-17 (×2): via INTRAVENOUS

## 2017-04-17 MED ORDER — SENNOSIDES-DOCUSATE SODIUM 8.6-50 MG PO TABS: 1.0000 | ORAL_TABLET | Freq: Every evening | ORAL | 1 refills | Status: AC | PRN

## 2017-04-17 MED ORDER — ASPIRIN 81 MG PO TBEC: 81.0000 mg | DELAYED_RELEASE_TABLET | Freq: Every day | ORAL | Status: DC

## 2017-04-17 MED ORDER — PROPOFOL 10 MG/ML IV BOLUS
INTRAVENOUS | Status: DC | PRN
  Administered 2017-04-17: 20 mg via INTRAVENOUS

## 2017-04-17 MED ORDER — OXYCODONE HCL ER 15 MG PO T12A
15.0000 mg | EXTENDED_RELEASE_TABLET | Freq: Two times a day (BID) | ORAL | Status: DC
Start: 2017-04-17 — End: 2017-04-19
  Administered 2017-04-17 – 2017-04-19 (×4): 15 mg via ORAL
  Filled 2017-04-17 (×4): qty 1

## 2017-04-17 MED ORDER — DIPHENHYDRAMINE HCL 12.5 MG/5ML PO ELIX: 25.0000 mg | ORAL_SOLUTION | ORAL | Status: DC | PRN

## 2017-04-17 MED ORDER — METHOCARBAMOL 500 MG PO TABS
ORAL_TABLET | ORAL | Status: AC
  Administered 2017-04-17: 500 mg via ORAL
  Filled 2017-04-17: qty 1

## 2017-04-17 MED ORDER — ALUM & MAG HYDROXIDE-SIMETH 200-200-20 MG/5ML PO SUSP: 30.0000 mL | ORAL | Status: DC | PRN

## 2017-04-17 MED ORDER — VITAMIN D 1000 UNITS PO TABS
1000.0000 [IU] | ORAL_TABLET | ORAL | Status: DC
Start: 2017-04-17 — End: 2017-04-19
  Administered 2017-04-19: 1000 [IU] via ORAL
  Filled 2017-04-17: qty 1

## 2017-04-17 MED ORDER — SODIUM CHLORIDE 0.9 % IV SOLN
INTRAVENOUS | Status: AC | PRN
  Administered 2017-04-17: 2000 mg via TOPICAL

## 2017-04-17 MED ORDER — METHOCARBAMOL 500 MG PO TABS
500.0000 mg | ORAL_TABLET | Freq: Four times a day (QID) | ORAL | Status: DC | PRN
  Administered 2017-04-17 – 2017-04-19 (×5): 500 mg via ORAL
  Filled 2017-04-17 (×5): qty 1

## 2017-04-17 MED ORDER — PHENYLEPHRINE HCL 10 MG/ML IJ SOLN
INTRAVENOUS | Status: DC | PRN
  Administered 2017-04-17: 15 ug/min via INTRAVENOUS

## 2017-04-17 MED ORDER — METHOCARBAMOL 750 MG PO TABS
750.0000 mg | ORAL_TABLET | Freq: Two times a day (BID) | ORAL | 0 refills | Status: AC | PRN
Start: 2017-04-17 — End: ?

## 2017-04-17 MED ORDER — HYDROMORPHONE HCL 1 MG/ML IJ SOLN
0.2500 mg | INTRAMUSCULAR | Status: DC | PRN
  Administered 2017-04-17 (×3): 0.5 mg via INTRAVENOUS

## 2017-04-17 MED ORDER — TRANEXAMIC ACID 1000 MG/10ML IV SOLN
2000.0000 mg | Freq: Once | INTRAVENOUS | Status: DC
  Filled 2017-04-17 (×2): qty 20

## 2017-04-17 MED ORDER — PROMETHAZINE HCL 25 MG/ML IJ SOLN: 6.2500 mg | INTRAMUSCULAR | Status: DC | PRN

## 2017-04-17 MED ORDER — METHOCARBAMOL 1000 MG/10ML IJ SOLN
500.0000 mg | Freq: Four times a day (QID) | INTRAVENOUS | Status: DC | PRN
  Filled 2017-04-17: qty 5

## 2017-04-17 MED ORDER — MIDAZOLAM HCL 2 MG/2ML IJ SOLN
INTRAMUSCULAR | Status: AC
  Filled 2017-04-17: qty 2

## 2017-04-17 MED ORDER — PROMETHAZINE HCL 25 MG PO TABS: 25.0000 mg | ORAL_TABLET | Freq: Four times a day (QID) | ORAL | 1 refills | Status: AC | PRN

## 2017-04-17 MED ORDER — ACETAMINOPHEN 500 MG PO TABS
1000.0000 mg | ORAL_TABLET | Freq: Four times a day (QID) | ORAL | Status: AC
  Administered 2017-04-17 – 2017-04-18 (×3): 1000 mg via ORAL
  Filled 2017-04-17 (×3): qty 2

## 2017-04-17 MED ORDER — SODIUM CHLORIDE 0.9 % IV SOLN
INTRAVENOUS | Status: DC
  Administered 2017-04-17: 15:00:00 via INTRAVENOUS

## 2017-04-17 MED ORDER — ASPIRIN EC 325 MG PO TBEC: 325.0000 mg | DELAYED_RELEASE_TABLET | Freq: Two times a day (BID) | ORAL | 0 refills | Status: DC

## 2017-04-17 MED ORDER — ONDANSETRON HCL 4 MG PO TABS: 4.0000 mg | ORAL_TABLET | Freq: Four times a day (QID) | ORAL | Status: DC | PRN

## 2017-04-17 MED ORDER — HYDROMORPHONE HCL 2 MG PO TABS
2.0000 mg | ORAL_TABLET | ORAL | Status: DC | PRN
  Administered 2017-04-18 – 2017-04-19 (×7): 2 mg via ORAL
  Filled 2017-04-17 (×6): qty 1

## 2017-04-17 MED ORDER — HYDROMORPHONE HCL 1 MG/ML IJ SOLN
INTRAMUSCULAR | Status: AC
  Filled 2017-04-17: qty 0.5

## 2017-04-17 MED ORDER — FENTANYL CITRATE (PF) 100 MCG/2ML IJ SOLN
INTRAMUSCULAR | Status: DC | PRN
  Administered 2017-04-17 (×3): 50 ug via INTRAVENOUS
  Administered 2017-04-17: 100 ug via INTRAVENOUS

## 2017-04-17 MED ORDER — MAGNESIUM CITRATE PO SOLN: 1.0000 | Freq: Once | ORAL | Status: DC | PRN

## 2017-04-17 MED ORDER — GENTAMICIN SULFATE 40 MG/ML IJ SOLN
INTRAMUSCULAR | Status: AC
  Filled 2017-04-17: qty 2

## 2017-04-17 MED ORDER — KETOROLAC TROMETHAMINE 15 MG/ML IJ SOLN
30.0000 mg | Freq: Four times a day (QID) | INTRAMUSCULAR | Status: DC
  Administered 2017-04-17: 30 mg via INTRAVENOUS

## 2017-04-17 MED ORDER — BUPIVACAINE IN DEXTROSE 0.75-8.25 % IT SOLN
INTRATHECAL | Status: DC | PRN
Start: 2017-04-17 — End: 2017-04-17
  Administered 2017-04-17: 15 mg via INTRATHECAL

## 2017-04-17 MED ORDER — KETOROLAC TROMETHAMINE 15 MG/ML IJ SOLN
15.0000 mg | Freq: Four times a day (QID) | INTRAMUSCULAR | Status: AC
  Administered 2017-04-17 – 2017-04-18 (×3): 15 mg via INTRAVENOUS
  Filled 2017-04-17 (×3): qty 1

## 2017-04-17 MED ORDER — MIDAZOLAM HCL 5 MG/5ML IJ SOLN
INTRAMUSCULAR | Status: DC | PRN
  Administered 2017-04-17: 2 mg via INTRAVENOUS

## 2017-04-17 MED ORDER — VANCOMYCIN HCL IN DEXTROSE 1-5 GM/200ML-% IV SOLN
INTRAVENOUS | Status: AC
  Filled 2017-04-17: qty 200

## 2017-04-17 MED ORDER — OXYCODONE HCL ER 10 MG PO T12A: 10.0000 mg | EXTENDED_RELEASE_TABLET | Freq: Two times a day (BID) | ORAL | 0 refills | Status: DC

## 2017-04-17 MED ORDER — ASPIRIN EC 325 MG PO TBEC
325.0000 mg | DELAYED_RELEASE_TABLET | Freq: Two times a day (BID) | ORAL | Status: DC
  Administered 2017-04-17 – 2017-04-19 (×4): 325 mg via ORAL
  Filled 2017-04-17 (×4): qty 1

## 2017-04-17 MED ORDER — METOCLOPRAMIDE HCL 5 MG/ML IJ SOLN: 5.0000 mg | Freq: Three times a day (TID) | INTRAMUSCULAR | Status: DC | PRN

## 2017-04-17 MED ORDER — VANCOMYCIN HCL IN DEXTROSE 1-5 GM/200ML-% IV SOLN
1000.0000 mg | Freq: Two times a day (BID) | INTRAVENOUS | Status: AC
  Administered 2017-04-17 – 2017-04-18 (×2): 1000 mg via INTRAVENOUS
  Filled 2017-04-17 (×2): qty 200

## 2017-04-17 MED ORDER — FENTANYL CITRATE (PF) 250 MCG/5ML IJ SOLN
INTRAMUSCULAR | Status: AC
  Filled 2017-04-17: qty 5

## 2017-04-17 MED ORDER — DEXAMETHASONE SODIUM PHOSPHATE 10 MG/ML IJ SOLN
10.0000 mg | Freq: Once | INTRAMUSCULAR | Status: AC
  Administered 2017-04-18: 10 mg via INTRAVENOUS
  Filled 2017-04-17: qty 1

## 2017-04-17 MED ORDER — TRANEXAMIC ACID 1000 MG/10ML IV SOLN
1000.0000 mg | Freq: Once | INTRAVENOUS | Status: AC
  Administered 2017-04-17: 1000 mg via INTRAVENOUS
  Filled 2017-04-17: qty 10

## 2017-04-17 MED ORDER — ONDANSETRON HCL 4 MG PO TABS: 4.0000 mg | ORAL_TABLET | Freq: Three times a day (TID) | ORAL | 0 refills | Status: AC | PRN

## 2017-04-17 MED ORDER — PROPOFOL 500 MG/50ML IV EMUL
INTRAVENOUS | Status: DC | PRN
  Administered 2017-04-17: 75 ug/kg/min via INTRAVENOUS

## 2017-04-17 MED ORDER — SODIUM CHLORIDE 0.9 % IR SOLN
Status: DC | PRN
  Administered 2017-04-17: 3000 mL

## 2017-04-17 MED ORDER — ROPINIROLE HCL 0.5 MG PO TABS
0.2500 mg | ORAL_TABLET | Freq: Every day | ORAL | Status: DC
  Administered 2017-04-17 – 2017-04-18 (×2): 0.25 mg via ORAL
  Filled 2017-04-17 (×2): qty 1

## 2017-04-17 MED ORDER — ONDANSETRON HCL 4 MG/2ML IJ SOLN: 4.0000 mg | Freq: Four times a day (QID) | INTRAMUSCULAR | Status: DC | PRN

## 2017-04-17 MED ORDER — CHLORHEXIDINE GLUCONATE 4 % EX LIQD
60.0000 mL | Freq: Once | CUTANEOUS | Status: DC
Start: 2017-04-17 — End: 2017-04-17
  Administered 2017-04-17: 4 via TOPICAL

## 2017-04-17 MED ORDER — SORBITOL 70 % SOLN: 30.0000 mL | Freq: Every day | Status: DC | PRN

## 2017-04-17 MED ORDER — 0.9 % SODIUM CHLORIDE (POUR BTL) OPTIME
TOPICAL | Status: DC | PRN
  Administered 2017-04-17: 1000 mL

## 2017-04-17 MED ORDER — ACETAMINOPHEN 325 MG PO TABS
650.0000 mg | ORAL_TABLET | Freq: Four times a day (QID) | ORAL | Status: DC | PRN
  Administered 2017-04-18 – 2017-04-19 (×2): 650 mg via ORAL
  Filled 2017-04-17 (×2): qty 2

## 2017-04-17 MED ORDER — VANCOMYCIN HCL 1000 MG IV SOLR
INTRAVENOUS | Status: DC | PRN
  Administered 2017-04-17: 1000 mg via INTRAVENOUS

## 2017-04-17 MED ORDER — POLYETHYLENE GLYCOL 3350 17 G PO PACK: 17.0000 g | PACK | Freq: Every day | ORAL | Status: DC | PRN

## 2017-04-17 MED ORDER — MORPHINE SULFATE (PF) 4 MG/ML IV SOLN
1.0000 mg | INTRAVENOUS | Status: DC | PRN
  Administered 2017-04-17 – 2017-04-19 (×3): 1 mg via INTRAVENOUS
  Filled 2017-04-17 (×3): qty 1

## 2017-04-17 MED ORDER — METOCLOPRAMIDE HCL 5 MG PO TABS: 5.0000 mg | ORAL_TABLET | Freq: Three times a day (TID) | ORAL | Status: DC | PRN

## 2017-04-17 MED ORDER — PHENOL 1.4 % MT LIQD
1.0000 | OROMUCOSAL | Status: DC | PRN
Start: 2017-04-17 — End: 2017-04-19

## 2017-04-17 MED ORDER — MENTHOL 3 MG MT LOZG: 1.0000 | LOZENGE | OROMUCOSAL | Status: DC | PRN

## 2017-04-17 MED ORDER — HYDROMORPHONE HCL 2 MG PO TABS: 2.0000 mg | ORAL_TABLET | ORAL | 0 refills | Status: DC | PRN

## 2017-04-17 MED ORDER — KETOROLAC TROMETHAMINE 30 MG/ML IJ SOLN
INTRAMUSCULAR | Status: AC
  Filled 2017-04-17: qty 1

## 2017-04-17 SURGICAL SUPPLY — 66 items
ALCOHOL ISOPROPYL (RUBBING) (MISCELLANEOUS) ×3 IMPLANT
APL SKNCLS STERI-STRIP NONHPOA (GAUZE/BANDAGES/DRESSINGS) ×1
BAG DECANTER FOR FLEXI CONT (MISCELLANEOUS) ×3 IMPLANT
BANDAGE ACE 6X5 VEL STRL LF (GAUZE/BANDAGES/DRESSINGS) ×6 IMPLANT
BANDAGE ELASTIC 6 VELCRO ST LF (GAUZE/BANDAGES/DRESSINGS) ×3 IMPLANT
BANDAGE ESMARK 6X9 LF (GAUZE/BANDAGES/DRESSINGS) ×1 IMPLANT
BENZOIN TINCTURE PRP APPL 2/3 (GAUZE/BANDAGES/DRESSINGS) ×3 IMPLANT
BLADE SAW SGTL 13.0X1.19X90.0M (BLADE) ×3 IMPLANT
BNDG CMPR 9X6 STRL LF SNTH (GAUZE/BANDAGES/DRESSINGS) ×1
BNDG ESMARK 6X9 LF (GAUZE/BANDAGES/DRESSINGS) ×3
BONE CEMENT PALACOS R-G (Cement) ×6 IMPLANT
BOWL SMART MIX CTS (DISPOSABLE) ×3 IMPLANT
CAPT KNEE TOTAL 3 ×3 IMPLANT
CEMENT BONE PALACOS R-G (Cement) ×2 IMPLANT
CLOSURE STERI-STRIP 1/2X4 (GAUZE/BANDAGES/DRESSINGS) ×2
CLSR STERI-STRIP ANTIMIC 1/2X4 (GAUZE/BANDAGES/DRESSINGS) ×4 IMPLANT
COVER SURGICAL LIGHT HANDLE (MISCELLANEOUS) ×3 IMPLANT
CUFF TOURNIQUET SINGLE 34IN LL (TOURNIQUET CUFF) ×3 IMPLANT
CUFF TOURNIQUET SINGLE 44IN (TOURNIQUET CUFF) IMPLANT
DRAPE EXTREMITY T 121X128X90 (DRAPE) ×3 IMPLANT
DRAPE HALF SHEET 40X57 (DRAPES) ×3 IMPLANT
DRAPE INCISE IOBAN 66X45 STRL (DRAPES) IMPLANT
DRAPE ORTHO SPLIT 77X108 STRL (DRAPES) ×6
DRAPE SURG 17X11 SM STRL (DRAPES) ×6 IMPLANT
DRAPE SURG ORHT 6 SPLT 77X108 (DRAPES) ×2 IMPLANT
DRSG AQUACEL AG ADV 3.5X14 (GAUZE/BANDAGES/DRESSINGS) ×3 IMPLANT
DURAPREP 26ML APPLICATOR (WOUND CARE) ×6 IMPLANT
ELECT CAUTERY BLADE 6.4 (BLADE) ×3 IMPLANT
ELECT REM PT RETURN 9FT ADLT (ELECTROSURGICAL) ×3
ELECTRODE REM PT RTRN 9FT ADLT (ELECTROSURGICAL) ×1 IMPLANT
GLOVE SKINSENSE NS SZ7.5 (GLOVE) ×2
GLOVE SKINSENSE STRL SZ7.5 (GLOVE) ×1 IMPLANT
GLOVE SURG SYN 7.5  E (GLOVE) ×8
GLOVE SURG SYN 7.5 E (GLOVE) ×4 IMPLANT
GOWN STRL REIN XL XLG (GOWN DISPOSABLE) ×3 IMPLANT
GOWN STRL REUS W/ TWL LRG LVL3 (GOWN DISPOSABLE) ×1 IMPLANT
GOWN STRL REUS W/TWL LRG LVL3 (GOWN DISPOSABLE) ×3
HANDPIECE INTERPULSE COAX TIP (DISPOSABLE) ×3
HOOD PEEL AWAY FLYTE STAYCOOL (MISCELLANEOUS) ×6 IMPLANT
KIT BASIN OR (CUSTOM PROCEDURE TRAY) ×3 IMPLANT
KIT ROOM TURNOVER OR (KITS) ×3 IMPLANT
MANIFOLD NEPTUNE II (INSTRUMENTS) ×3 IMPLANT
MARKER SKIN DUAL TIP RULER LAB (MISCELLANEOUS) ×3 IMPLANT
NEEDLE SPNL 18GX3.5 QUINCKE PK (NEEDLE) ×3 IMPLANT
NS IRRIG 1000ML POUR BTL (IV SOLUTION) ×3 IMPLANT
PACK TOTAL JOINT (CUSTOM PROCEDURE TRAY) ×3 IMPLANT
PAD ARMBOARD 7.5X6 YLW CONV (MISCELLANEOUS) ×6 IMPLANT
SAW OSC TIP CART 19.5X105X1.3 (SAW) ×3 IMPLANT
SET HNDPC FAN SPRY TIP SCT (DISPOSABLE) ×1 IMPLANT
STAPLER VISISTAT 35W (STAPLE) IMPLANT
SUCTION FRAZIER HANDLE 10FR (MISCELLANEOUS) ×2
SUCTION TUBE FRAZIER 10FR DISP (MISCELLANEOUS) ×1 IMPLANT
SUT ETHILON 2 0 FS 18 (SUTURE) IMPLANT
SUT MNCRL AB 4-0 PS2 18 (SUTURE) IMPLANT
SUT VIC AB 0 CT1 27 (SUTURE) ×6
SUT VIC AB 0 CT1 27XBRD ANBCTR (SUTURE) ×2 IMPLANT
SUT VIC AB 1 CTX 27 (SUTURE) ×9 IMPLANT
SUT VIC AB 2-0 CT1 27 (SUTURE) ×9
SUT VIC AB 2-0 CT1 TAPERPNT 27 (SUTURE) ×3 IMPLANT
SYR 50ML LL SCALE MARK (SYRINGE) ×3 IMPLANT
TAPE STRIPS DRAPE STRL (GAUZE/BANDAGES/DRESSINGS) ×2 IMPLANT
TOWEL OR 17X24 6PK STRL BLUE (TOWEL DISPOSABLE) ×3 IMPLANT
TOWEL OR 17X26 10 PK STRL BLUE (TOWEL DISPOSABLE) ×3 IMPLANT
TRAY CATH 16FR W/PLASTIC CATH (SET/KITS/TRAYS/PACK) IMPLANT
UNDERPAD 30X30 (UNDERPADS AND DIAPERS) ×3 IMPLANT
WRAP KNEE MAXI GEL POST OP (GAUZE/BANDAGES/DRESSINGS) ×3 IMPLANT

## 2017-04-17 NOTE — Anesthesia Postprocedure Evaluation (Signed)
Anesthesia Post Note  Patient: Corey Carter  Procedure(s) Performed: Procedure(s) (LRB): RIGHT TOTAL KNEE ARTHROPLASTY (Right)     Patient location during evaluation: PACU Anesthesia Type: MAC Level of consciousness: awake and alert Pain management: pain level controlled Vital Signs Assessment: post-procedure vital signs reviewed and stable Respiratory status: spontaneous breathing and respiratory function stable Cardiovascular status: blood pressure returned to baseline and stable Postop Assessment: spinal receding Anesthetic complications: no    Last Vitals:  Vitals:   04/17/17 1120 04/17/17 1125  BP: 115/66   Pulse: 73 67  Resp: 12 12  Temp:  36.6 C    Last Pain:  Vitals:   04/17/17 1119  TempSrc:   PainSc: 6                  Sheli Dorin DANIEL
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
## 2017-04-17 NOTE — Anesthesia Procedure Notes (Signed)
Anesthesia Regional Block: Adductor canal block   Pre-Anesthetic Checklist: ,, timeout performed, Correct Patient, Correct Site, Correct Laterality, Correct Procedure, Correct Position, site marked, Risks and benefits discussed,  Surgical consent,  Pre-op evaluation,  At surgeon's request and post-op pain management  Laterality: Right  Prep: chloraprep       Needles:  Injection technique: Single-shot  Needle Type: Stimulator Needle - 80     Needle Length: 10cm  Needle Gauge: 21     Additional Needles:   Procedures: ultrasound guided,,,,,,,,  Narrative:  Start time: 04/17/2017 8:01 AM End time: 04/17/2017 8:11 AM Injection made incrementally with aspirations every 5 mL.  Performed by: Personally

## 2017-04-17 NOTE — Discharge Instructions (Signed)

## 2017-04-17 NOTE — Anesthesia Preprocedure Evaluation (Addendum)
Anesthesia Evaluation  Patient identified by MRN, date of birth, ID band Patient awake    Reviewed: Allergy & Precautions, NPO status , Patient's Chart, lab work & pertinent test results  History of Anesthesia Complications Negative for: history of anesthetic complications  Airway Mallampati: II  TM Distance: >3 FB Neck ROM: Full    Dental no notable dental hx. (+) Dental Advisory Given, Edentulous Upper, Edentulous Lower   Pulmonary sleep apnea , former smoker,    Pulmonary exam normal        Cardiovascular hypertension, Normal cardiovascular exam     Neuro/Psych negative neurological ROS  negative psych ROS   GI/Hepatic Neg liver ROS, GERD  ,  Endo/Other  negative endocrine ROS  Renal/GU negative Renal ROS     Musculoskeletal negative musculoskeletal ROS (+)   Abdominal   Peds  Hematology negative hematology ROS (+)   Anesthesia Other Findings Day of surgery medications reviewed with the patient.  Reproductive/Obstetrics                            Anesthesia Physical Anesthesia Plan  ASA: II  Anesthesia Plan: MAC and Spinal   Post-op Pain Management:  Regional for Post-op pain   Induction:   PONV Risk Score and Plan: Ondansetron and Dexamethasone  Airway Management Planned: Natural Airway  Additional Equipment:   Intra-op Plan:   Post-operative Plan: Extubation in OR  Informed Consent: I have reviewed the patients History and Physical, chart, labs and discussed the procedure including the risks, benefits and alternatives for the proposed anesthesia with the patient or authorized representative who has indicated his/her understanding and acceptance.   Dental advisory given  Plan Discussed with: CRNA, Anesthesiologist and Surgeon  Anesthesia Plan Comments:        Anesthesia Quick Evaluation

## 2017-04-17 NOTE — Evaluation (Signed)
Physical Therapy Evaluation Patient Details Name: Corey Carter MRN: [id number] DOB: 1946-05-13 Today's Date: 04/17/2017   History of Present Illness  Pt is a 71 y/o male s/p elective R TKA. PMH includes HTN, bells palsy, L foot surgery, R arm/hand surgery, R knee arthroscopy, and back surgery.   Clinical Impression  Pt is s/p surgery above with deficits below. PTA, pt was independent with functional mobility. Upon eval, pt limited by post op pain and weakness, as well as decreased sensation and decreased balance. Pt required min guard assist for safety during mobility and pt with slight knee buckling. Pt reports wife is not able to assist at home, however, daughters available to check on him throughout the day. DME recommendations below and follow up recommendations per MD arrangements. Will continue to follow acutely to maximize functional mobility independence.     Follow Up Recommendations DC plan and follow up therapy as arranged by surgeon;Supervision for mobility/OOB    Equipment Recommendations  Rolling walker with 5" wheels;3in1 (PT)    Recommendations for Other Services       Precautions / Restrictions Precautions Precautions: Knee Precaution Booklet Issued: Yes (comment) Precaution Comments: Reviewed supine ther ex with pt.  Restrictions Weight Bearing Restrictions: Yes RLE Weight Bearing: Weight bearing as tolerated      Mobility  Bed Mobility Overal bed mobility: Needs Assistance Bed Mobility: Supine to Sit     Supine to sit: Min guard     General bed mobility comments: Min guard for safety.   Transfers Overall transfer level: Needs assistance Equipment used: Rolling walker (2 wheeled) Transfers: Sit to/from Stand Sit to Stand: Min guard         General transfer comment: Min guard for safety. Verbal cues for safe hand placement.   Ambulation/Gait Ambulation/Gait assistance: Min guard Ambulation Distance (Feet): 25 Feet Assistive device: Rolling walker  (2 wheeled) Gait Pattern/deviations: Step-to pattern;Decreased step length - right;Decreased step length - left;Decreased weight shift to right;Antalgic Gait velocity: Decreased Gait velocity interpretation: Below normal speed for age/gender General Gait Details: Slow, antalgic gait secondary to post op pain and weakness. Slight RLE buckling at knee secondary to decreased sensation, however, no overt LOB noted. Verbal cues for sequencing with use of RW.   Stairs            Wheelchair Mobility    Modified Rankin (Stroke Patients Only)       Balance Overall balance assessment: Needs assistance Sitting-balance support: No upper extremity supported;Feet supported Sitting balance-Leahy Scale: Good     Standing balance support: Bilateral upper extremity supported;During functional activity Standing balance-Leahy Scale: Poor Standing balance comment: Reliant on UE support for balance                              Pertinent Vitals/Pain Pain Assessment: 0-10 Pain Score: 5  Pain Location: R knee  Pain Descriptors / Indicators: Aching;Operative site guarding;Sore Pain Intervention(s): Limited activity within patient's tolerance;Monitored during session;Repositioned    Home Living Family/patient expects to be discharged to:: Private residence Living Arrangements: Spouse/significant other Available Help at Discharge: Family;Available PRN/intermittently (wife unable to assist ) Type of Home: Mobile home Home Access: Stairs to enter Entrance Stairs-Rails: Left Entrance Stairs-Number of Steps: 2 Home Layout: One level Home Equipment: None      Prior Function Level of Independence: Independent               Hand Dominance   Dominant Hand: Right  Extremity/Trunk Assessment   Upper Extremity Assessment Upper Extremity Assessment: Defer to OT evaluation    Lower Extremity Assessment Lower Extremity Assessment: RLE deficits/detail RLE Deficits / Details:  Slight numbness from knee down into foot. Deficits consistent with post op pain and weakness. Able to perform exercises below.     Cervical / Trunk Assessment Cervical / Trunk Assessment: Normal  Communication   Communication: No difficulties  Cognition Arousal/Alertness: Awake/alert Behavior During Therapy: WFL for tasks assessed/performed Overall Cognitive Status: Within Functional Limits for tasks assessed                                        General Comments General comments (skin integrity, edema, etc.): Pt's daughter present during session.     Exercises Total Joint Exercises Ankle Circles/Pumps: AROM;Both;10 reps;Supine Quad Sets: AROM;Right;10 reps;Supine Towel Squeeze: AROM;Both;10 reps;Supine Short Arc Quad: AROM;Right;10 reps;Supine Heel Slides: AROM;Right;10 reps;Supine Hip ABduction/ADduction: AROM;Right;10 reps;Supine   Assessment/Plan    PT Assessment Patient needs continued PT services  PT Problem List Decreased strength;Decreased balance;Decreased range of motion;Decreased activity tolerance;Decreased mobility;Decreased knowledge of use of DME;Decreased knowledge of precautions;Pain       PT Treatment Interventions DME instruction;Gait training;Functional mobility training;Stair training;Therapeutic activities;Therapeutic exercise;Neuromuscular re-education;Balance training;Patient/family education    PT Goals (Current goals can be found in the Care Plan section)  Acute Rehab PT Goals Patient Stated Goal: to go home  PT Goal Formulation: With patient Time For Goal Achievement: 04/24/17 Potential to Achieve Goals: Good    Frequency 7X/week   Barriers to discharge Decreased caregiver support Reports wife cannot assist, however, daughters will be present as needed     Co-evaluation               AM-PAC PT "6 Clicks" Daily Activity  Outcome Measure Difficulty turning over in bed (including adjusting bedclothes, sheets and  blankets)?: A Little Difficulty moving from lying on back to sitting on the side of the bed? : Total Difficulty sitting down on and standing up from a chair with arms (e.g., wheelchair, bedside commode, etc,.)?: Total Help needed moving to and from a bed to chair (including a wheelchair)?: A Little Help needed walking in hospital room?: A Little Help needed climbing 3-5 steps with a railing? : A Little 6 Click Score: 14    End of Session Equipment Utilized During Treatment: Gait belt Activity Tolerance: Patient tolerated treatment well Patient left: in chair;with call bell/phone within reach;with family/visitor present Nurse Communication: Mobility status PT Visit Diagnosis: Other abnormalities of gait and mobility (R26.89);Pain Pain - Right/Left: Right Pain - part of body: Knee    Time: 1722-1745 PT Time Calculation (min) (ACUTE ONLY): 23 min   Charges:   PT Evaluation $PT Eval Low Complexity: 1 Procedure PT Treatments $Gait Training: 8-22 mins   PT G Codes:        Gladys DammeBrittany Elfriede Bonini, PT, DPT  Acute Rehabilitation Services  Pager: [phone]   Lehman PromBrittany S Munira Polson 04/17/2017, 6:44 PM

## 2017-04-17 NOTE — Op Note (Signed)
Total Knee Arthroplasty Procedure Note  Preoperative diagnosis: Right knee osteoarthritis  Postoperative diagnosis:same  Operative procedure: Right total knee arthroplasty. CPT 205 [id number] [id number]  Surgeon: N. Glee ArvinMichael Vanden Fawaz, MD  Assistants: April Green, RNFA  Anesthesia: Spinal, regional  Tourniquet time: 1 hr  Implants used: [employer] Femur: PS 7 Tibia: 8 Patella: 32 mm, 7.5 thick Polyethylene: 11 mm  Indication: Corey SitesRoger Carter is a 71 y.o. year old male with a history of knee pain. Having failed conservative management, the patient elected to proceed with a total knee arthroplasty.  We have reviewed the risk and benefits of the surgery and they elected to proceed after voicing understanding.  Procedure:  After informed consent was obtained and understanding of the risk were voiced including but not limited to bleeding, infection, damage to surrounding structures including nerves and vessels, blood clots, leg length inequality and the failure to achieve desired results, the operative extremity was marked with verbal confirmation of the patient in the holding area.   The patient was then brought to the operating room and transported to the operating room table in the supine position.  A tourniquet was applied to the operative extremity around the upper thigh. The operative limb was then prepped and draped in the usual sterile fashion and preoperative antibiotics were administered.  A time out was performed prior to the start of surgery confirming the correct extremity, preoperative antibiotic administration, as well as team members, implants and instruments available for the case. Correct surgical site was also confirmed with preoperative radiographs. The limb was then elevated for exsanguination and the tourniquet was inflated. A midline incision was made and a standard medial parapatellar approach was performed.  The patella was prepared and sized to a 32 mm.  A cover was placed on the  patella for protection from retractors.  We then turned our attention to the femur. Posterior cruciate ligament was sacrificed. Start site was drilled in the femur and the intramedullary distal femoral cutting guide was placed, set at 5 degrees valgus, taking 11 mm of distal resection. The distal cut was made. Osteophytes were then removed. Next, the proximal tibial cutting guide was placed with appropriate slope, varus/valgus alignment and depth of resection. The proximal tibial cut was made. Gap blocks were then used to assess the extension gap and alignment, and appropriate soft tissue releases were performed. Attention was turned back to the femur, which was sized using the sizing guide to a size 7. Appropriate rotation of the femoral component was determined using epicondylar axis, Whiteside's line, and assessing the flexion gap under ligament tension. The appropriate size 4-in-1 cutting block was placed and cuts were made. Posterior femoral osteophytes and uncapped bone were then removed with the curved osteotome. The tibia was sized for a size 8 component. The femoral box-cutting guide was placed and prepared for a PS femoral component. Trial components were placed, and stability was checked in full extension, mid-flexion, and deep flexion. Proper tibial rotation was determined and marked.  The patella tracked well without a lateral release. Trial components were then removed and tibial preparation performed. A posterior capsular injection comprising of 20 cc of 1.3% exparel and 40 cc of normal saline was performed for postoperative pain control. The bony surfaces were irrigated with a pulse lavage and then dried. Bone cement was vacuum mixed on the back table, and the final components sized above were cemented into place. After cement had finished curing, excess cement was removed. The stability of the construct was  re-evaluated throughout a range of motion and found to be acceptable. The trial liner was  removed, the knee was copiously irrigated, and the knee was re-evaluated for any excess bone debris. The real polyethylene liner, 11 mm thick, was inserted and checked to ensure the locking mechanism had engaged appropriately. The tourniquet was deflated and hemostasis was achieved. The wound was irrigated with dilute betadine in normal saline, and then again with normal saline. A drain was not placed. Capsular closure was performed with a #1 vicryl, subcutaneous fat closed with a 2.0 vicryl suture, then subcutaneous tissue closed with interrupted 2.0 vicryl suture. The skin was then closed with a 3.0 monocryl. A sterile dressing was applied.   The patient was awakened in the operating room and taken to recovery in stable condition. All sponge, needle, and instrument counts were correct at the end of the case.  Position: supine  Complications: none.  Time Out: performed   Drains/Packing: none  Estimated blood loss: minimal  Returned to Recovery Room: in good condition.   Antibiotics: yes   Mechanical VTE (DVT) Prophylaxis: sequential compression devices, TED thigh-high  Chemical VTE (DVT) Prophylaxis: aspirin  Fluid Replacement  Crystalloid: see anesthesia record Blood: none  FFP: none   Specimens Removed: 1 to pathology   Sponge and Instrument Count Correct? yes   PACU: portable radiograph - knee AP and Lateral   Admission: inpatient status  Plan/RTC: Return in 2 weeks for wound check.   Weight Bearing/Load Lower Extremity: full   N. Glee Arvin, MD [facility] [phone] 10:11 AM

## 2017-04-17 NOTE — Anesthesia Procedure Notes (Signed)
Spinal  Patient location during procedure: OR Start time: 04/17/2017 8:25 AM End time: 04/17/2017 8:35 AM Staffing Anesthesiologist: Heather RobertsSINGER, Corey Mcdill Performed: anesthesiologist  Preanesthetic Checklist Completed: patient identified, surgical consent, pre-op evaluation, timeout performed, IV checked, risks and benefits discussed and monitors and equipment checked Spinal Block Patient position: sitting Prep: DuraPrep Patient monitoring: cardiac monitor, continuous pulse ox and blood pressure Approach: midline Injection technique: single-shot Needle Needle type: Whitacre  Needle gauge: 24 G Needle length: 9 cm Additional Notes Functioning IV was confirmed and monitors were applied. Sterile prep and drape, including hand hygiene and sterile gloves were used. The patient was positioned and the spine was prepped. The skin was anesthetized with lidocaine.  Free flow of clear CSF was obtained prior to injecting local anesthetic into the CSF.  The spinal needle aspirated freely following injection.  The needle was carefully withdrawn.  The patient tolerated the procedure well.

## 2017-04-17 NOTE — Progress Notes (Signed)
Orthopedic Tech Progress Note Patient Details:  Corey SitesRoger Carter 10/05/1945 [id number]  CPM Right Knee CPM Right Knee: On Right Knee Flexion (Degrees): 90 Right Knee Extension (Degrees): 0 Additional Comments: trapeze bar patient helper   Nikki DomCrawford, Lovella Hardie 04/17/2017, 11:08 AM Viewed order from doctor's order list

## 2017-04-17 NOTE — Anesthesia Procedure Notes (Signed)
Procedure Name: MAC Date/Time: 04/17/2017 8:00 AM Performed by: Trixie Deis A Pre-anesthesia Checklist: Patient identified, Emergency Drugs available, Suction available, Patient being monitored and Timeout performed Oxygen Delivery Method: Simple face mask Placement Confirmation: positive ETCO2 Dental Injury: Teeth and Oropharynx as per pre-operative assessment

## 2017-04-17 NOTE — Transfer of Care (Signed)
Immediate Anesthesia Transfer of Care Note  Patient: Corey Carter  Procedure(s) Performed: Procedure(s): RIGHT TOTAL KNEE ARTHROPLASTY (Right)  Patient Location: PACU  Anesthesia Type:MAC and Spinal  Level of Consciousness: awake, alert  and oriented  Airway & Oxygen Therapy: Patient Spontanous Breathing  Post-op Assessment: Report given to RN, Post -op Vital signs reviewed and stable and Patient moving all extremities  Post vital signs: Reviewed and stable  Last Vitals:  Vitals:   04/17/17 0709  BP: 102/64  Pulse: 67  Resp: 18  Temp: 37.1 C    Last Pain:  Vitals:   04/17/17 0709  TempSrc: Oral  PainSc:          Complications: No apparent anesthesia complications

## 2017-04-18 ENCOUNTER — Encounter (HOSPITAL_COMMUNITY): Payer: Self-pay | Admitting: Orthopaedic Surgery

## 2017-04-18 LAB — BASIC METABOLIC PANEL
ANION GAP: 5 (ref 5–15)
BUN: 11 mg/dL (ref 6–20)
CO2: 27 mmol/L (ref 22–32)
Calcium: 8.5 mg/dL — ABNORMAL LOW (ref 8.9–10.3)
Chloride: 104 mmol/L (ref 101–111)
Creatinine, Ser: 0.88 mg/dL (ref 0.61–1.24)
GFR calc Af Amer: >60 mL/min (ref >60)
GFR calc non Af Amer: >60 mL/min (ref >60)
GLUCOSE: 92 mg/dL (ref 65–99)
POTASSIUM: 4.2 mmol/L (ref 3.5–5.1)
Sodium: 136 mmol/L (ref 135–145)

## 2017-04-18 LAB — CBC
HEMATOCRIT: 33.9 % — AB (ref 39.0–52.0)
Hemoglobin: 11.2 g/dL — ABNORMAL LOW (ref 13.0–17.0)
MCH: 32.6 pg (ref 26.0–34.0)
MCHC: 33 g/dL (ref 30.0–36.0)
MCV: 98.5 fL (ref 78.0–100.0)
Platelets: 251 10*3/uL (ref 150–400)
RBC: 3.44 MIL/uL — AB (ref 4.22–5.81)
RDW: 12.4 % (ref 11.5–15.5)
WBC: 9.9 10*3/uL (ref 4.0–10.5)

## 2017-04-18 NOTE — Evaluation (Signed)
Occupational Therapy Evaluation Patient Details Name: Corey SitesRoger Nesbit MRN: [id number] DOB: 07/27/1946 Today's Date: 04/18/2017    History of Present Illness Pt is a 71 y/o male s/p elective R TKA. PMH includes HTN, bells palsy, L foot surgery, R arm/hand surgery, R knee arthroscopy, and back surgery.    Clinical Impression   Pt admitted with the above diagnoses and presents with below problem list. Pt will benefit from continued acute OT to address the below listed deficits and maximize independence with basic ADLs prior to d/c home. PTA pt was independent with ADLs and caregiver is for spouse. Pt is currently min guard with LB ADLs and functional mobility. Daughters live nearby and able to assist at d/c. ADL education provided to pt.     Follow Up Recommendations  DC plan and follow up therapy as arranged by surgeon    Equipment Recommendations  3 in 1 bedside commode    Recommendations for Other Services       Precautions / Restrictions Precautions Precautions: Knee Precaution Comments: reviewed Restrictions Weight Bearing Restrictions: No RLE Weight Bearing: Weight bearing as tolerated      Mobility Bed Mobility               General bed mobility comments: OOB upon arrival of OT  Transfers Overall transfer level: Needs assistance Equipment used: Rolling walker (2 wheeled) Transfers: Sit to/from Stand Sit to Stand: Min guard         General transfer comment: Min guard for safety. Verbal cues for safe hand placement.     Balance Overall balance assessment: Needs assistance Sitting-balance support: No upper extremity supported;Feet supported Sitting balance-Leahy Scale: Good     Standing balance support: Bilateral upper extremity supported;During functional activity Standing balance-Leahy Scale: Poor Standing balance comment: Reliant on UE support for balance                            ADL either performed or assessed with clinical judgement    ADL Overall ADL's : Needs assistance/impaired Eating/Feeding: Set up;Sitting   Grooming: Set up;Min guard;Sitting;Standing Grooming Details (indicate cue type and reason): sat for most of grooming Upper Body Bathing: Set up;Sitting   Lower Body Bathing: Min guard;Sit to/from stand   Upper Body Dressing : Set up;Sitting   Lower Body Dressing: Min guard;Sit to/from stand   Toilet Transfer: Min guard;Ambulation;RW (3n1 over toilet)   Toileting- Clothing Manipulation and Hygiene: Set up;Min guard;Sitting/lateral lean;Sit to/from stand   Tub/ Shower Transfer: Tub transfer;Min guard;Ambulation;Tub bench   Functional mobility during ADLs: Min guard;Rolling walker General ADL Comments: Pt received on 3n1 over toilet in bathroom. Ambulated to sink and completed grooming task as detailed above. ADL education provided. Able to reach foot in seated position for LB ADLs.      Vision         Perception     Praxis      Pertinent Vitals/Pain Pain Assessment: 0-10 Pain Score: 6  Pain Location: R knee  Pain Descriptors / Indicators: Aching;Operative site guarding;Sore Pain Intervention(s): Limited activity within patient's tolerance;Monitored during session;Repositioned     Hand Dominance Right   Extremity/Trunk Assessment Upper Extremity Assessment Upper Extremity Assessment: Overall WFL for tasks assessed   Lower Extremity Assessment Lower Extremity Assessment: Defer to PT evaluation   Cervical / Trunk Assessment Cervical / Trunk Assessment: Normal   Communication Communication Communication: No difficulties   Cognition Arousal/Alertness: Awake/alert Behavior During Therapy: WFL for tasks assessed/performed  Overall Cognitive Status: Within Functional Limits for tasks assessed                                     General Comments       Exercises     Shoulder Instructions      Home Living Family/patient expects to be discharged to:: Private  residence Living Arrangements: Spouse/significant other Available Help at Discharge: Family;Available PRN/intermittently (wife unable to assist) Type of Home: Mobile home Home Access: Stairs to enter Entrance Stairs-Number of Steps: 2 Entrance Stairs-Rails: Left Home Layout: One level     Bathroom Shower/Tub: Chief Strategy Officer: Standard     Home Equipment: None;Tub bench   Additional Comments: caregiver for spouse. daughters live nearby and able to assist       Prior Functioning/Environment Level of Independence: Independent                 OT Problem List: Impaired balance (sitting and/or standing);Decreased knowledge of use of DME or AE;Decreased knowledge of precautions;Pain      OT Treatment/Interventions: Self-care/ADL training;DME and/or AE instruction;Therapeutic activities;Patient/family education;Balance training    OT Goals(Current goals can be found in the care plan section) Acute Rehab OT Goals Patient Stated Goal: to go home  OT Goal Formulation: With patient Time For Goal Achievement: 04/25/17 Potential to Achieve Goals: Good ADL Goals Pt Will Perform Grooming: with modified independence;standing Pt Will Perform Lower Body Bathing: with modified independence;sit to/from stand Pt Will Perform Lower Body Dressing: with modified independence;sit to/from stand Pt Will Perform Tub/Shower Transfer: Tub transfer;tub bench;rolling walker  OT Frequency: Min 2X/week   Barriers to D/C:            Co-evaluation              AM-PAC PT "6 Clicks" Daily Activity     Outcome Measure Help from another person eating meals?: None Help from another person taking care of personal grooming?: None Help from another person toileting, which includes using toliet, bedpan, or urinal?: A Little Help from another person bathing (including washing, rinsing, drying)?: A Little Help from another person to put on and taking off regular upper body  clothing?: None Help from another person to put on and taking off regular lower body clothing?: None 6 Click Score: 22   End of Session Equipment Utilized During Treatment: Rolling walker CPM Right Knee CPM Right Knee: On Right Knee Flexion (Degrees): 0 Right Knee Extension (Degrees): 90  Activity Tolerance: Patient tolerated treatment well;Patient limited by pain Patient left: with call bell/phone within reach;Other (comment) (at sink on 3n1, NT prepared pt for sponge bathing at sink)  OT Visit Diagnosis: Pain Pain - Right/Left: Right Pain - part of body: Knee                Time: 1610-9604 OT Time Calculation (min): 16 min Charges:  OT General Charges $OT Visit: 1 Procedure OT Evaluation $OT Eval Low Complexity: 1 Procedure G-Codes:      Pilar Grammes 04/18/2017, 10:15 AM

## 2017-04-18 NOTE — Progress Notes (Addendum)
Physical Therapy Treatment Patient Details Name: Corey SitesRoger Frane MRN: [id number] DOB: 08/30/1946 Today's Date: 04/18/2017    History of Present Illness Pt is a 71 y/o male s/p elective R TKA. PMH includes HTN, bells palsy, L foot surgery, R arm/hand surgery, R knee arthroscopy, and back surgery.     PT Comments    Pt demonstrates improved tolerance for gait and ROM activities this session. A slight improvement in pain controlled noted, but pt continues to have increased pain following activity. ROM measurements noted below. Pt continues to require follow-up to review HEP.     Follow Up Recommendations  DC plan and follow up therapy as arranged by surgeon;Supervision for mobility/OOB     Equipment Recommendations  Rolling walker with 5" wheels;3in1 (PT)    Recommendations for Other Services       Precautions / Restrictions Precautions Precautions: Knee Precaution Booklet Issued: Yes (comment) Precaution Comments: reviewed Restrictions Weight Bearing Restrictions: Yes RLE Weight Bearing: Weight bearing as tolerated    Mobility  Bed Mobility Overal bed mobility: Needs Assistance Bed Mobility: Supine to Sit;Sit to Supine     Supine to sit: Min guard Sit to supine: Min guard   General bed mobility comments: min guard for safety  Transfers Overall transfer level: Needs assistance Equipment used: Rolling walker (2 wheeled) Transfers: Sit to/from Stand Sit to Stand: Min guard         General transfer comment: Min guard for safety. Verbal cues for safe hand placement.   Ambulation/Gait Ambulation/Gait assistance: Min guard Ambulation Distance (Feet): 150 Feet Assistive device: Rolling walker (2 wheeled) Gait Pattern/deviations: Decreased step length - right;Decreased step length - left;Decreased weight shift to right;Antalgic;Step-through pattern Gait velocity: Decreased Gait velocity interpretation: Below normal speed for age/gender General Gait Details: continues to  have moderate antalgic gait, improved swing through sequencing   Stairs            Wheelchair Mobility    Modified Rankin (Stroke Patients Only)       Balance Overall balance assessment: Needs assistance Sitting-balance support: No upper extremity supported;Feet supported Sitting balance-Leahy Scale: Normal     Standing balance support: No upper extremity supported Standing balance-Leahy Scale: Fair Standing balance comment: Reliant on UE support for balance                             Cognition Arousal/Alertness: Awake/alert Behavior During Therapy: WFL for tasks assessed/performed Overall Cognitive Status: Within Functional Limits for tasks assessed                                        Exercises Total Joint Exercises Ankle Circles/Pumps: AROM;Both;10 reps;Supine Quad Sets: AROM;Right;10 reps;Supine Heel Slides: AROM;Right;10 reps;Supine Goniometric ROM: 5-90    General Comments        Pertinent Vitals/Pain Pain Assessment: 0-10 Pain Score: 4  Pain Location: R knee  Pain Descriptors / Indicators: Aching;Operative site guarding;Sore Pain Intervention(s): Monitored during session    Home Living                      Prior Function            PT Goals (current goals can now be found in the care plan section) Acute Rehab PT Goals Patient Stated Goal: to go home  Progress towards PT goals: Progressing toward goals  Frequency    7X/week      PT Plan Current plan remains appropriate    Co-evaluation              AM-PAC PT "6 Clicks" Daily Activity  Outcome Measure  Difficulty turning over in bed (including adjusting bedclothes, sheets and blankets)?: None Difficulty moving from lying on back to sitting on the side of the bed? : None Difficulty sitting down on and standing up from a chair with arms (e.g., wheelchair, bedside commode, etc,.)?: A Little Help needed moving to and from a bed to chair  (including a wheelchair)?: A Little Help needed walking in hospital room?: A Little Help needed climbing 3-5 steps with a railing? : A Little 6 Click Score: 20    End of Session Equipment Utilized During Treatment: Gait belt Activity Tolerance: Patient tolerated treatment well Patient left: in bed;with call bell/phone within reach Nurse Communication: Mobility status PT Visit Diagnosis: Other abnormalities of gait and mobility (R26.89);Pain Pain - Right/Left: Right Pain - part of body: Knee     Time: 1450-1507 PT Time Calculation (min) (ACUTE ONLY): 17 min  Charges:  $Gait Training: 8-22 mins                     G Codes:       Colin Broach PT, DPT  [phone]    Ruel Favors Aletha Halim 04/18/2017, 3:11 PM

## 2017-04-18 NOTE — Progress Notes (Signed)
   Subjective:  Patient reports pain as moderate.  No events.  Objective:   VITALS:   Vitals:   04/17/17 1400 04/17/17 1523 04/18/17 0026 04/18/17 0504  BP: (!) 167/78 (!) 94/54 (!) 111/53 (!) 105/48  Pulse: (!) 109 61 (!) 59 (!) 53  Resp:  19 18 18   Temp: 98.1 F (36.7 C) 98.7 F (37.1 C) 98.2 F (36.8 C) 98.8 F (37.1 C)  TempSrc: Oral Oral Oral Oral  SpO2: 100% 97% 95% 97%    Neurologically intact Neurovascular intact Sensation intact distally Intact pulses distally Dorsiflexion/Plantar flexion intact Incision: dressing C/D/I and no drainage No cellulitis present Compartment soft   Lab Results  Component Value Date   WBC 9.9 04/18/2017   HGB 11.2 (L) 04/18/2017   HCT 33.9 (L) 04/18/2017   MCV 98.5 04/18/2017   PLT 251 04/18/2017     Assessment/Plan:  1 Day Post-Op   - Expected postop acute blood loss anemia - will monitor for symptoms - Up with PT/OT - DVT ppx - SCDs, ambulation, aspirin - WBAT operative extremity - Pain control - Discharge planning-  Home today after stair training  Corey Carter 04/18/2017, 7:48 AM [phone]

## 2017-04-18 NOTE — Progress Notes (Signed)
Physical Therapy Treatment Patient Details Name: Corey SitesRoger Carter MRN: [id number] DOB: 12/26/1945 Today's Date: 04/18/2017    History of Present Illness Pt is a 71 y/o male s/p elective R TKA. PMH includes HTN, bells palsy, L foot surgery, R arm/hand surgery, R knee arthroscopy, and back surgery.     PT Comments    Pt continues to have increased pain with mobility through right knee with all activity with a constant pain at 7/10. Performed quad sets with towel roll and heel slides. Pt was instructed to remove towel roll for a period of time if pain is too intense to tolerate.     Follow Up Recommendations  DC plan and follow up therapy as arranged by surgeon;Supervision for mobility/OOB     Equipment Recommendations  Rolling walker with 5" wheels;3in1 (PT)    Recommendations for Other Services       Precautions / Restrictions Precautions Precautions: Knee Precaution Booklet Issued: Yes (comment) Precaution Comments: reviewed Restrictions Weight Bearing Restrictions: Yes RLE Weight Bearing: Weight bearing as tolerated    Mobility  Bed Mobility               General bed mobility comments: In recliner when PT arrives  Transfers Overall transfer level: Needs assistance Equipment used: Rolling walker (2 wheeled) Transfers: Sit to/from Stand Sit to Stand: Min guard         General transfer comment: Min guard for safety. Verbal cues for safe hand placement.   Ambulation/Gait Ambulation/Gait assistance: Min guard Ambulation Distance (Feet): 120 Feet Assistive device: Rolling walker (2 wheeled) Gait Pattern/deviations: Step-to pattern;Decreased step length - right;Decreased step length - left;Decreased weight shift to right;Antalgic Gait velocity: Decreased Gait velocity interpretation: Below normal speed for age/gender General Gait Details: Moderate antalgic gait with increased pain with weight bearing through RLE.    Stairs            Wheelchair Mobility     Modified Rankin (Stroke Patients Only)       Balance Overall balance assessment: Needs assistance Sitting-balance support: No upper extremity supported;Feet supported Sitting balance-Leahy Scale: Good     Standing balance support: Bilateral upper extremity supported;During functional activity Standing balance-Leahy Scale: Poor Standing balance comment: Reliant on UE support for balance                             Cognition Arousal/Alertness: Awake/alert Behavior During Therapy: WFL for tasks assessed/performed Overall Cognitive Status: Within Functional Limits for tasks assessed                                        Exercises Total Joint Exercises Ankle Circles/Pumps: AROM;Both;10 reps;Supine Quad Sets: AROM;Right;10 reps;Supine Heel Slides: AROM;Right;10 reps;Supine    General Comments        Pertinent Vitals/Pain Pain Assessment: 0-10 Pain Score: 7  Pain Location: R knee  Pain Descriptors / Indicators: Aching;Operative site guarding;Sore Pain Intervention(s): Monitored during session;Premedicated before session;Repositioned;Ice applied    Home Living                      Prior Function            PT Goals (current goals can now be found in the care plan section) Acute Rehab PT Goals Patient Stated Goal: to go home  Progress towards PT goals: Progressing toward goals  Frequency    7X/week      PT Plan Current plan remains appropriate    Co-evaluation              AM-PAC PT "6 Clicks" Daily Activity  Outcome Measure  Difficulty turning over in bed (including adjusting bedclothes, sheets and blankets)?: None Difficulty moving from lying on back to sitting on the side of the bed? : None Difficulty sitting down on and standing up from a chair with arms (e.g., wheelchair, bedside commode, etc,.)?: Total Help needed moving to and from a bed to chair (including a wheelchair)?: A Little Help needed walking  in hospital room?: A Little Help needed climbing 3-5 steps with a railing? : A Little 6 Click Score: 18    End of Session Equipment Utilized During Treatment: Gait belt Activity Tolerance: Patient tolerated treatment well Patient left: in chair;with call bell/phone within reach;with family/visitor present Nurse Communication: Mobility status PT Visit Diagnosis: Other abnormalities of gait and mobility (R26.89);Pain Pain - Right/Left: Right Pain - part of body: Knee     Time: 1050-1117 PT Time Calculation (min) (ACUTE ONLY): 27 min  Charges:  $Gait Training: 8-22 mins $Therapeutic Exercise: 8-22 mins                    G Codes:       Colin Broach PT, DPT  [phone]    Roxy Manns 04/18/2017, 1:04 PM

## 2017-04-18 NOTE — Care Management Note (Signed)
Case Management Note  Patient Details  Name: Corey SitesRoger Carter MRN: [id number] Date of Birth: 02/17/1946  Subjective/Objective:  71 yr old gentleman s/p right total hip arthroplasty.                 Action/Plan:  Case manager spoke with patient concerning discharge plan and DME needs. Patient was preoperatively setup with [facility], no changes. CM has requested RW and 3in1 for patient. He will have family support at discharge.    Expected Discharge Date:   04/19/17               Expected Discharge Plan:  Home w Home Health Services  In-House Referral:  NA  Discharge planning Services  CM Consult  Post Acute Care Choice:  Durable Medical Equipment, Home Health Choice offered to:  Patient  DME Arranged:  3-N-1, Walker rolling DME Agency:  [facility]  HH Arranged:  PT HH Agency[facility]  [facility]  Status of Service:  Completed, signed off  If discussed at [employer] Meetings, dates discussed:    Additional Comments:  Durenda GuthrieBrady, Jahmya Onofrio Naomi, RN 04/18/2017, 2:42 PM

## 2017-04-19 NOTE — Progress Notes (Signed)
Pt discharged to home in stable condition.  All discharge instructions and RX's reviewed and given to pt.  Pt transported via wheelchair, transporters x1 and NT @ chairside.

## 2017-04-19 NOTE — Progress Notes (Signed)
Physical Therapy Treatment Patient Details Name: Corey Carter MRN: [id number] DOB: [birth year] Today's Date: 04/19/2017    History of Present Illness Pt is a 71 y/o male s/p elective R TKA. PMH includes HTN, bells palsy, L foot surgery, R arm/hand surgery, R knee arthroscopy, and back surgery.     PT Comments    Pt performed am tx exceptionally well and continues to progress toward goals. Pt able to perform stair training with mod cues for hand placement and to decrease feet to step space to improve safety. Pt has improved gait sequence and reports that he doesn't feel as tight once he gets up and walk. Pt still requires min cues for R heel strike and to increase LLE step length. Pt to d/c to home this am. Focus on next tx on adding additional standing exercises to increase RLE strength.   Follow Up Recommendations  DC plan and follow up therapy as arranged by surgeon;Supervision for mobility/OOB     Equipment Recommendations  Rolling walker with 5" wheels;3in1 (PT)    Recommendations for Other Services       Precautions / Restrictions Precautions Precautions: Knee Precaution Comments: Reviewed  Restrictions Weight Bearing Restrictions: Yes RLE Weight Bearing: Weight bearing as tolerated    Mobility  Bed Mobility Overal bed mobility: Modified Independent Bed Mobility: Supine to Sit;Sit to Supine     Supine to sit: Modified independent (Device/Increase time) Sit to supine: Supervision   General bed mobility comments: cues to postition RLE prior to descending into supine.   Transfers Overall transfer level: Modified independent Equipment used: Rolling walker (2 wheeled) Transfers: Sit to/from Stand Sit to Stand: Min guard         General transfer comment: Min guard for safety. Verbal cues for safe hand placement. Pt tends to "flop" down once hands are on arm rest/bed. Min vc's to descend slowly.   Ambulation/Gait Ambulation/Gait assistance: Supervision Ambulation  Distance (Feet): 250 Feet (x2 with 1 seated rest break between trials. ) Assistive device: Rolling walker (2 wheeled) Gait Pattern/deviations: Step-through pattern;Decreased step length - left;Decreased stance time - right;Decreased dorsiflexion - right;Decreased weight shift to right;Trunk flexed Gait velocity: Increased. Pt required min vc's to slow down for saftey.    General Gait Details: Pt respoded well to cues to increase R knee extension along with  R heel strike/ increase dorsiflexion. Pt also requires min cues to stand up straight to help improve posture during gait.    Stairs Stairs: Yes  Min guard  Stair Management: One rail Right;Step to pattern;Forwards;With walker Number of Stairs: 6 General stair comments: Pt performed stair training well but requires mod cues for hand placement and to decrease distance from feet to step with ascending and descending to improve safety.    Wheelchair Mobility    Modified Rankin (Stroke Patients Only)       Balance Overall balance assessment: Needs assistance Sitting-balance support: No upper extremity supported;Feet supported Sitting balance-Leahy Scale: Normal     Standing balance support: No upper extremity supported Standing balance-Leahy Scale: Fair Standing balance comment: Reliant on UE support for balance                             Cognition Arousal/Alertness: Awake/alert Behavior During Therapy: WFL for tasks assessed/performed Overall Cognitive Status: Within Functional Limits for tasks assessed  General Comments: Pt very willing and understanding of the importance of PT.       Exercises Total Joint Exercises Ankle Circles/Pumps: AROM;Both;10 reps;Supine Hip ABduction/ADduction: AROM;Right;10 reps;Standing (Pt able to perform x5 on LLE, but reports increase in pain on R knee. ) Knee Flexion: AROM;Both;10 reps;Standing Marching in Standing: AROM;Both;10  reps;Standing    General Comments        Pertinent Vitals/Pain Pain Assessment: 0-10 Pain Score: 5  Pain Location: R knee  Pain Descriptors / Indicators: Aching;Dull;Tightness Pain Intervention(s): Monitored during session;Premedicated before session;Ice applied    Home Living                      Prior Function            PT Goals (current goals can now be found in the care plan section) Acute Rehab PT Goals Patient Stated Goal: to go home  PT Goal Formulation: With patient Potential to Achieve Goals: Good Progress towards PT goals: Progressing toward goals    Frequency    7X/week      PT Plan Current plan remains appropriate    Co-evaluation              AM-PAC PT "6 Clicks" Daily Activity  Outcome Measure  Difficulty turning over in bed (including adjusting bedclothes, sheets and blankets)?: None Difficulty moving from lying on back to sitting on the side of the bed? : None Difficulty sitting down on and standing up from a chair with arms (e.g., wheelchair, bedside commode, etc,.)?: A Little Help needed moving to and from a bed to chair (including a wheelchair)?: A Little Help needed walking in hospital room?: A Little Help needed climbing 3-5 steps with a railing? : A Little 6 Click Score: 20    End of Session Equipment Utilized During Treatment: Gait belt Activity Tolerance: Patient tolerated treatment well Patient left: in bed;with call bell/phone within reach Nurse Communication: Mobility status PT Visit Diagnosis: Other abnormalities of gait and mobility (R26.89);Pain Pain - Right/Left: Right Pain - part of body: Knee     Time: [id number] PT Time Calculation (min) (ACUTE ONLY): 45 min  Charges:  $Gait Training: 23-37 mins $Therapeutic Exercise: 8-22 mins                    G CodesFerman Hamming:       Blayton Huttner, [place]PT[place] [phone]   Ferman HammingSean Kyndall Chaplin 04/19/2017, 1:09 PM

## 2017-04-19 NOTE — Progress Notes (Signed)
Orthopedic Tech Progress Note Patient Details:  Corey SitesRoger Carter 12/25/1945 [id number]  CPM Right Knee CPM Right Knee: On Right Knee Flexion (Degrees): 90 Right Knee Extension (Degrees): 0 Additional Comments: right knee on at 0-90 degrees.     Alvina ChouWilliams, Drevon Plog C 04/19/2017, 6:14 AM

## 2017-04-19 NOTE — Discharge Summary (Signed)
Physician Discharge Summary      Patient ID: Corey SitesRoger Breunig MRN: [id number] DOB/AGE: 71/11/1945 71 y.o.  Admit date: 04/17/2017 Discharge date: 04/19/2017  Admission Diagnoses:  <principal problem not specified>  Discharge Diagnoses:  Active Problems:   Total knee replacement status   Past Medical History:  Diagnosis Date  . Arthritis    "left ankle; right knee; right hand" (04/18/2017)  . Bell's palsy 90's  . Bell's palsy   . Chronic back pain   . GERD (gastroesophageal reflux disease)   . History of kidney stones    "passed them"  . History of stomach ulcers   . Hypertension   . Pneumonia 1980s X1  . Sleep apnea    "don't use mask anymore" (04/18/2017)    Surgeries: Procedure(s): RIGHT TOTAL KNEE ARTHROPLASTY on 04/17/2017   Consultants (if any):   Discharged Condition: Improved  Hospital Course: Corey SitesRoger Peckham is an 71 y.o. male who was admitted 04/17/2017 with a diagnosis of <principal problem not specified> and went to the operating room on 04/17/2017 and underwent the above named procedures.    He was given perioperative antibiotics:  Anti-infectives    Start     Dose/Rate Route Frequency Ordered Stop   04/17/17 2030  vancomycin (VANCOCIN) IVPB 1000 mg/200 mL premix     1,000 mg 200 mL/hr over 60 Minutes Intravenous Every 12 hours 04/17/17 1236 04/18/17 1031   04/17/17 0818  vancomycin (VANCOCIN) 1-5 GM/200ML-% IVPB    Comments:  Ponciano OrtCato, Sarah   : cabinet override      04/17/17 0818 04/17/17 2029   04/17/17 0800  clindamycin (CLEOCIN) IVPB 900 mg     900 mg 100 mL/hr over 30 Minutes Intravenous To ShortStay Surgical 04/16/17 1206 04/17/17 0830    .  He was given sequential compression devices, early ambulation, and aspirin for DVT prophylaxis.  He benefited maximally from the hospital stay and there were no complications.    Recent vital signs:  Vitals:   04/18/17 2150 04/19/17 0414  BP: (!) 124/56 113/61  Pulse: 77 69  Resp: 18 18  Temp: 98.9 F (37.2 C)  98.5 F (36.9 C)    Recent laboratory studies:  Lab Results  Component Value Date   HGB 11.2 (L) 04/18/2017   HGB 12.9 (L) 04/09/2017   HGB 11.2 (L) 03/15/2016   Lab Results  Component Value Date   WBC 9.9 04/18/2017   PLT 251 04/18/2017   Lab Results  Component Value Date   INR 1.00 04/09/2017   Lab Results  Component Value Date   NA 136 04/18/2017   K 4.2 04/18/2017   CL 104 04/18/2017   CO2 27 04/18/2017   BUN 11 04/18/2017   CREATININE 0.88 04/18/2017   GLUCOSE 92 04/18/2017    Discharge Medications:   Allergies as of 04/19/2017      Reactions   Penicillins Swelling   "SEVERE SWELLING OF JOINTS" PATIENT HAS HAD A PCN REACTION WITH IMMEDIATE RASH, FACIAL/TONGUE/THROAT SWELLING, SOB, OR LIGHTHEADEDNESS WITH HYPOTENSION:  #  #  #  YES  #  #  #   Has patient had a PCN reaction causing severe rash involving mucus membranes or skin necrosis: no PATIENT HAS HAD A PCN REACTION THAT REQUIRED HOSPITALIZATION:  #  #  #  YES  #  #  #   Has patient had a PCN reaction occurring within the last 10 years: no   Codeine    "my wife says I'm allergic to this" pt  unsure of a reaction that he has      Medication List    TAKE these medications   aspirin EC 325 MG tablet Take 1 tablet (325 mg total) by mouth 2 (two) times daily. What changed:  medication strength  how much to take  when to take this  additional instructions   cholecalciferol 1000 units tablet Commonly known as:  VITAMIN D Take 1,000 Units by mouth every other day.   hydrochlorothiazide 25 MG tablet Commonly known as:  HYDRODIURIL Take 25 mg by mouth daily.   HYDROcodone-acetaminophen 10-325 MG tablet Commonly known as:  NORCO Take 1 tablet by mouth every 4 (four) hours as needed for moderate pain.   HYDROmorphone 2 MG tablet Commonly known as:  DILAUDID Take 1 tablet (2 mg total) by mouth every 4 (four) hours as needed for severe pain.   lisinopril 20 MG tablet Commonly known as:   PRINIVIL,ZESTRIL Take 10 mg by mouth daily.   methocarbamol 750 MG tablet Commonly known as:  ROBAXIN Take 1 tablet (750 mg total) by mouth 2 (two) times daily as needed for muscle spasms.   MIDNITE PM PO Take 1 tablet by mouth at bedtime as needed (sleep).   ondansetron 4 MG tablet Commonly known as:  ZOFRAN Take 1-2 tablets (4-8 mg total) by mouth every 8 (eight) hours as needed for nausea or vomiting. What changed:  how much to take  when to take this  reasons to take this   oxyCODONE 10 mg 12 hr tablet Commonly known as:  OXYCONTIN Take 1 tablet (10 mg total) by mouth every 12 (twelve) hours.   promethazine 25 MG tablet Commonly known as:  PHENERGAN Take 1 tablet (25 mg total) by mouth every 6 (six) hours as needed for nausea.   ranitidine 150 MG tablet Commonly known as:  ZANTAC Take 150 mg by mouth 2 (two) times daily.   rOPINIRole 0.25 MG tablet Commonly known as:  REQUIP Take 0.25 mg by mouth at bedtime.   senna-docusate 8.6-50 MG tablet Commonly known as:  SENOKOT S Take 1 tablet by mouth at bedtime as needed.            Durable Medical Equipment        Start     Ordered   04/17/17 1236  DME Walker rolling  Once    Question:  Patient needs a walker to treat with the following condition  Answer:  Total knee replacement status   04/17/17 1235   04/17/17 1236  DME 3 n 1  Once     04/17/17 1235   04/17/17 1236  DME Bedside commode  Once    Question:  Patient needs a bedside commode to treat with the following condition  Answer:  Total knee replacement status   04/17/17 1235      Diagnostic Studies: Dg Knee Right Port  Result Date: 04/17/2017 CLINICAL DATA:  71 year old male status post right knee replacement. EXAM: PORTABLE RIGHT KNEE - 1-2 VIEW COMPARISON:  03/06/2013. FINDINGS: Portable AP and cross-table lateral views at 1047 hours. New right total knee arthroplasty. Hardware appears intact and normally aligned. Postoperative gas and fluid in  the joint space. No unexpected osseous changes. IMPRESSION: Right total knee arthroplasty with no adverse features. Electronically Signed   By: Odessa Fleming M.D.   On: 04/17/2017 10:58   Xr Knee 3 View Right  Result Date: 03/25/2017 Moderate to severe degenerative joint disease   Disposition: 01-Home or Self Care  Discharge  Instructions    Call MD / Call 911    Complete by:  As directed    If you experience chest pain or shortness of breath, CALL 911 and be transported to the hospital emergency room.  If you develope a fever above 101.5 F, pus (white drainage) or increased drainage or redness at the wound, or calf pain, call your surgeon's office.   Constipation Prevention    Complete by:  As directed    Drink plenty of fluids.  Prune juice may be helpful.  You may use a stool softener, such as Colace (over the counter) 100 mg twice a day.  Use MiraLax (over the counter) for constipation as needed.   Driving restrictions    Complete by:  As directed    No driving while taking narcotic pain meds.   Increase activity slowly as tolerated    Complete by:  As directed       Follow-up Information    Tarry Kos, MD Follow up in 2 week(s).   Specialty:  Orthopedic Surgery Why:  For suture removal, For wound re-check Contact information: [address] [place] [address] [phone]        [facility] Follow up.   Specialty:  Home Health Services Why:  A representative from [facility] will contact you to arrange start date and time for your therapy. Contact information: [address] [place] [address] [place] [address] [phone]            Signed: Glee Arvin 04/19/2017, 7:34 AM

## 2017-04-19 NOTE — Progress Notes (Signed)
   Subjective:  Patient reports pain as moderate.  No events.  Objective:   VITALS:   Vitals:   04/18/17 0504 04/18/17 1437 04/18/17 2150 04/19/17 0414  BP: (!) 105/48 135/67 (!) 124/56 113/61  Pulse: (!) 53 86 77 69  Resp: 18 17 18 18   Temp: 98.8 F (37.1 C) 98.4 F (36.9 C) 98.9 F (37.2 C) 98.5 F (36.9 C)  TempSrc: Oral Oral Oral Oral  SpO2: 97% 96% 92% 97%    Neurologically intact Neurovascular intact Sensation intact distally Intact pulses distally Dorsiflexion/Plantar flexion intact Incision: dressing C/D/I and no drainage No cellulitis present Compartment soft   Lab Results  Component Value Date   WBC 9.9 04/18/2017   HGB 11.2 (L) 04/18/2017   HCT 33.9 (L) 04/18/2017   MCV 98.5 04/18/2017   PLT 251 04/18/2017     Assessment/Plan:  2 Days Post-Op   - pain controlled - PT this morning for stair training - home today  Corey Carter 04/19/2017, 7:33 AM [phone]

## 2017-04-22 ENCOUNTER — Telehealth (INDEPENDENT_AMBULATORY_CARE_PROVIDER_SITE_OTHER): Payer: Self-pay | Admitting: *Deleted

## 2017-04-22 NOTE — Telephone Encounter (Signed)
[facility] advising Physical Therapy is going out to home tomorrow for PT, just wanted to let us know.   If questions call back [phone]

## 2017-05-02 ENCOUNTER — Inpatient Hospital Stay (INDEPENDENT_AMBULATORY_CARE_PROVIDER_SITE_OTHER): Payer: Medicare Other | Admitting: Orthopaedic Surgery

## 2017-05-06 ENCOUNTER — Encounter (INDEPENDENT_AMBULATORY_CARE_PROVIDER_SITE_OTHER): Payer: Self-pay | Admitting: Orthopaedic Surgery

## 2017-05-06 ENCOUNTER — Ambulatory Visit (INDEPENDENT_AMBULATORY_CARE_PROVIDER_SITE_OTHER): Payer: Medicare Other | Admitting: Orthopaedic Surgery

## 2017-05-06 DIAGNOSIS — Z96651 Presence of right artificial knee joint: Secondary | ICD-10-CM

## 2017-05-06 DIAGNOSIS — M1711 Unilateral primary osteoarthritis, right knee: Secondary | ICD-10-CM

## 2017-05-06 MED ORDER — OXYCODONE-ACETAMINOPHEN 5-325 MG PO TABS: 1.0000 | ORAL_TABLET | Freq: Two times a day (BID) | ORAL | 0 refills | Status: DC | PRN

## 2017-05-06 NOTE — Progress Notes (Signed)
Patient is 2 weeks status post right total knee replacement. He is doing well overall. He is set to finish home health physical therapy in the next couple days. The lot and caused a rash. His incision is healed without any signs of infection. Range of motion is 10 to 95. From my standpoint patient is doing well. He may continue to work with physical therapy. Outpatient physical therapy referral was made. Percocet refilled. Follow-up in 4 weeks with 2 view x-rays of the right knee.

## 2017-05-16 ENCOUNTER — Telehealth (INDEPENDENT_AMBULATORY_CARE_PROVIDER_SITE_OTHER): Payer: Self-pay | Admitting: Orthopaedic Surgery

## 2017-05-16 NOTE — Telephone Encounter (Signed)
Please advise 

## 2017-05-16 NOTE — Telephone Encounter (Signed)
Patient called asking for a refill on oxycodone. Will be starting therapy next week and he is in a lot of pain and can't sleep at night. CB # [phone]

## 2017-05-16 NOTE — Telephone Encounter (Signed)
30

## 2017-05-17 MED ORDER — OXYCODONE-ACETAMINOPHEN 5-325 MG PO TABS: 1.0000 | ORAL_TABLET | Freq: Two times a day (BID) | ORAL | 0 refills | Status: DC | PRN

## 2017-05-17 NOTE — Telephone Encounter (Signed)
Rx printed ready for pick up at the front desk. Pt aware.

## 2017-05-17 NOTE — Addendum Note (Signed)
Addended by: Albertina Parr on: 05/17/2017 09:55 AM   Modules accepted: Orders

## 2017-05-27 ENCOUNTER — Telehealth (INDEPENDENT_AMBULATORY_CARE_PROVIDER_SITE_OTHER): Payer: Self-pay | Admitting: Orthopaedic Surgery

## 2017-05-27 NOTE — Telephone Encounter (Signed)
Patient called advised he was suppose to go to out patient therapy. Patient advised he has also run out of pain medicine. The number to contact patient is  [phone]

## 2017-05-28 NOTE — Telephone Encounter (Signed)
See message below °

## 2017-05-28 NOTE — Telephone Encounter (Signed)
Yeah we gave him an outpatient referral last time he was here.  Does he need [place] to fax in something?  Norco #30.

## 2017-05-30 ENCOUNTER — Other Ambulatory Visit (INDEPENDENT_AMBULATORY_CARE_PROVIDER_SITE_OTHER): Payer: Self-pay

## 2017-05-30 DIAGNOSIS — M1711 Unilateral primary osteoarthritis, right knee: Secondary | ICD-10-CM

## 2017-05-30 MED ORDER — HYDROCODONE-ACETAMINOPHEN 5-325 MG PO TABS: 1.0000 | ORAL_TABLET | Freq: Two times a day (BID) | ORAL | 0 refills | Status: DC | PRN

## 2017-05-30 NOTE — Telephone Encounter (Signed)
rx ready for pick up at the front desk pt aware also made PT referral

## 2017-06-07 ENCOUNTER — Ambulatory Visit: Payer: Medicare Other | Attending: Orthopaedic Surgery | Admitting: Physical Therapy

## 2017-06-07 DIAGNOSIS — M25561 Pain in right knee: Secondary | ICD-10-CM | POA: Insufficient documentation

## 2017-06-07 DIAGNOSIS — M25661 Stiffness of right knee, not elsewhere classified: Secondary | ICD-10-CM | POA: Diagnosis present

## 2017-06-07 DIAGNOSIS — R262 Difficulty in walking, not elsewhere classified: Secondary | ICD-10-CM | POA: Insufficient documentation

## 2017-06-07 DIAGNOSIS — M6281 Muscle weakness (generalized): Secondary | ICD-10-CM | POA: Insufficient documentation
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
## 2017-06-10 ENCOUNTER — Encounter (INDEPENDENT_AMBULATORY_CARE_PROVIDER_SITE_OTHER): Payer: Self-pay | Admitting: Orthopaedic Surgery

## 2017-06-10 ENCOUNTER — Ambulatory Visit: Payer: Medicare Other | Admitting: Physical Therapy

## 2017-06-10 ENCOUNTER — Ambulatory Visit (INDEPENDENT_AMBULATORY_CARE_PROVIDER_SITE_OTHER): Payer: Medicare Other

## 2017-06-10 ENCOUNTER — Ambulatory Visit (INDEPENDENT_AMBULATORY_CARE_PROVIDER_SITE_OTHER): Payer: Medicare Other | Admitting: Orthopaedic Surgery

## 2017-06-10 ENCOUNTER — Encounter: Payer: Self-pay | Admitting: Physical Therapy

## 2017-06-10 DIAGNOSIS — Z96651 Presence of right artificial knee joint: Secondary | ICD-10-CM

## 2017-06-10 DIAGNOSIS — M25561 Pain in right knee: Secondary | ICD-10-CM | POA: Diagnosis not present

## 2017-06-10 DIAGNOSIS — R262 Difficulty in walking, not elsewhere classified: Secondary | ICD-10-CM

## 2017-06-10 DIAGNOSIS — M25661 Stiffness of right knee, not elsewhere classified: Secondary | ICD-10-CM
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
## 2017-06-10 NOTE — Progress Notes (Signed)
Patient is 6 weeks status post right total knee replacement. Overall he is doing well. There was a delay in physical therapy which he has resumed now. He doesn't complain of any significant pain.  Physical exam shows a fully healed surgical scar. He has good range of motion. Collaterals are stable. X-ray show stable implant.  Patient is doing well from my standpoint. I'll see him back in 6 weeks for his 3 month visit. May resume his baby aspirin at this point. Antibiotic prophylaxis was reinforced.

## 2017-06-13 ENCOUNTER — Ambulatory Visit: Payer: Medicare Other | Admitting: Physical Therapy

## 2017-06-13 ENCOUNTER — Encounter: Payer: Self-pay | Admitting: Physical Therapy

## 2017-06-13 DIAGNOSIS — M25561 Pain in right knee: Secondary | ICD-10-CM

## 2017-06-13 DIAGNOSIS — M6281 Muscle weakness (generalized): Secondary | ICD-10-CM

## 2017-06-13 DIAGNOSIS — M25661 Stiffness of right knee, not elsewhere classified: Secondary | ICD-10-CM

## 2017-06-13 DIAGNOSIS — R262 Difficulty in walking, not elsewhere classified: Secondary | ICD-10-CM
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
## 2017-06-19 ENCOUNTER — Encounter: Payer: Self-pay | Admitting: Physical Therapy

## 2017-06-19 ENCOUNTER — Ambulatory Visit: Payer: Medicare Other | Admitting: Physical Therapy

## 2017-06-19 DIAGNOSIS — R262 Difficulty in walking, not elsewhere classified: Secondary | ICD-10-CM

## 2017-06-19 DIAGNOSIS — M25661 Stiffness of right knee, not elsewhere classified: Secondary | ICD-10-CM

## 2017-06-19 DIAGNOSIS — M25561 Pain in right knee: Secondary | ICD-10-CM

## 2017-06-19 DIAGNOSIS — M6281 Muscle weakness (generalized): Secondary | ICD-10-CM
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
## 2017-06-21 ENCOUNTER — Ambulatory Visit: Payer: Medicare Other | Admitting: Physical Therapy

## 2017-06-21 ENCOUNTER — Encounter: Payer: Self-pay | Admitting: Physical Therapy

## 2017-06-21 DIAGNOSIS — R262 Difficulty in walking, not elsewhere classified: Secondary | ICD-10-CM

## 2017-06-21 DIAGNOSIS — M6281 Muscle weakness (generalized): Secondary | ICD-10-CM

## 2017-06-21 DIAGNOSIS — M25561 Pain in right knee: Secondary | ICD-10-CM | POA: Diagnosis not present

## 2017-06-21 DIAGNOSIS — M25661 Stiffness of right knee, not elsewhere classified: Secondary | ICD-10-CM
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
## 2017-06-25 ENCOUNTER — Encounter: Payer: Self-pay | Admitting: Physical Therapy

## 2017-06-25 ENCOUNTER — Ambulatory Visit: Payer: Medicare Other | Admitting: Physical Therapy

## 2017-06-25 DIAGNOSIS — M6281 Muscle weakness (generalized): Secondary | ICD-10-CM

## 2017-06-25 DIAGNOSIS — M25561 Pain in right knee: Secondary | ICD-10-CM

## 2017-06-25 DIAGNOSIS — M25661 Stiffness of right knee, not elsewhere classified: Secondary | ICD-10-CM

## 2017-06-25 DIAGNOSIS — R262 Difficulty in walking, not elsewhere classified: Secondary | ICD-10-CM
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
## 2017-06-27 ENCOUNTER — Encounter: Payer: Self-pay | Admitting: Physical Therapy

## 2017-06-27 ENCOUNTER — Ambulatory Visit: Payer: Medicare Other | Admitting: Physical Therapy

## 2017-06-27 DIAGNOSIS — M25561 Pain in right knee: Secondary | ICD-10-CM | POA: Diagnosis not present

## 2017-06-27 DIAGNOSIS — M25661 Stiffness of right knee, not elsewhere classified: Secondary | ICD-10-CM

## 2017-06-27 DIAGNOSIS — M6281 Muscle weakness (generalized): Secondary | ICD-10-CM
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
## 2017-07-02 ENCOUNTER — Encounter: Payer: Medicare Other | Admitting: Physical Therapy

## 2017-07-04 ENCOUNTER — Encounter: Payer: Medicare Other | Admitting: Physical Therapy

## 2017-07-08 ENCOUNTER — Ambulatory Visit: Payer: Medicare Other | Attending: Orthopaedic Surgery | Admitting: Physical Therapy

## 2017-07-08 DIAGNOSIS — R262 Difficulty in walking, not elsewhere classified: Secondary | ICD-10-CM | POA: Insufficient documentation

## 2017-07-08 DIAGNOSIS — M25561 Pain in right knee: Secondary | ICD-10-CM | POA: Insufficient documentation

## 2017-07-08 DIAGNOSIS — M6281 Muscle weakness (generalized): Secondary | ICD-10-CM | POA: Insufficient documentation

## 2017-07-08 DIAGNOSIS — M25661 Stiffness of right knee, not elsewhere classified: Secondary | ICD-10-CM | POA: Insufficient documentation

## 2017-07-11 ENCOUNTER — Ambulatory Visit: Payer: Medicare Other | Admitting: Physical Therapy

## 2017-07-11 ENCOUNTER — Encounter: Payer: Self-pay | Admitting: Physical Therapy

## 2017-07-11 DIAGNOSIS — M25661 Stiffness of right knee, not elsewhere classified: Secondary | ICD-10-CM | POA: Diagnosis present

## 2017-07-11 DIAGNOSIS — R262 Difficulty in walking, not elsewhere classified: Secondary | ICD-10-CM

## 2017-07-11 DIAGNOSIS — M6281 Muscle weakness (generalized): Secondary | ICD-10-CM

## 2017-07-11 DIAGNOSIS — M25561 Pain in right knee: Secondary | ICD-10-CM
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
## 2017-07-16 ENCOUNTER — Ambulatory Visit: Payer: Medicare Other | Admitting: Physical Therapy

## 2017-07-16 ENCOUNTER — Encounter: Payer: Self-pay | Admitting: Physical Therapy

## 2017-07-16 DIAGNOSIS — M25561 Pain in right knee: Secondary | ICD-10-CM

## 2017-07-16 DIAGNOSIS — M25661 Stiffness of right knee, not elsewhere classified: Secondary | ICD-10-CM

## 2017-07-16 DIAGNOSIS — M6281 Muscle weakness (generalized): Secondary | ICD-10-CM

## 2017-07-16 DIAGNOSIS — R262 Difficulty in walking, not elsewhere classified: Secondary | ICD-10-CM
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
## 2017-07-16 NOTE — Patient Instructions (Signed)
Remove tape if irritating 

## 2017-07-18 ENCOUNTER — Ambulatory Visit: Payer: Medicare Other | Admitting: Physical Therapy

## 2017-07-18 ENCOUNTER — Encounter: Payer: Self-pay | Admitting: Physical Therapy

## 2017-07-18 DIAGNOSIS — M25661 Stiffness of right knee, not elsewhere classified: Secondary | ICD-10-CM

## 2017-07-18 DIAGNOSIS — M25561 Pain in right knee: Secondary | ICD-10-CM | POA: Diagnosis not present

## 2017-07-18 DIAGNOSIS — M6281 Muscle weakness (generalized): Secondary | ICD-10-CM

## 2017-07-18 DIAGNOSIS — R262 Difficulty in walking, not elsewhere classified: Secondary | ICD-10-CM
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
## 2017-07-25 ENCOUNTER — Ambulatory Visit (INDEPENDENT_AMBULATORY_CARE_PROVIDER_SITE_OTHER): Payer: Medicare Other | Admitting: Orthopaedic Surgery

## 2017-08-05 ENCOUNTER — Ambulatory Visit (INDEPENDENT_AMBULATORY_CARE_PROVIDER_SITE_OTHER): Payer: Medicare Other | Admitting: Orthopaedic Surgery

## 2018-03-21 ENCOUNTER — Ambulatory Visit (INDEPENDENT_AMBULATORY_CARE_PROVIDER_SITE_OTHER): Payer: Medicare Other | Admitting: Physician Assistant

## 2018-03-21 ENCOUNTER — Ambulatory Visit (INDEPENDENT_AMBULATORY_CARE_PROVIDER_SITE_OTHER): Payer: Medicare Other

## 2018-03-21 ENCOUNTER — Encounter (INDEPENDENT_AMBULATORY_CARE_PROVIDER_SITE_OTHER): Payer: Self-pay | Admitting: Physician Assistant

## 2018-03-21 DIAGNOSIS — Z96651 Presence of right artificial knee joint: Secondary | ICD-10-CM
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
## 2018-10-09 ENCOUNTER — Ambulatory Visit (INDEPENDENT_AMBULATORY_CARE_PROVIDER_SITE_OTHER): Payer: Medicare Other | Admitting: Orthopaedic Surgery

## 2018-10-09 ENCOUNTER — Ambulatory Visit (INDEPENDENT_AMBULATORY_CARE_PROVIDER_SITE_OTHER): Payer: Medicare Other

## 2018-10-09 DIAGNOSIS — G8929 Other chronic pain: Secondary | ICD-10-CM

## 2018-10-09 DIAGNOSIS — M25561 Pain in right knee: Secondary | ICD-10-CM

## 2018-10-09 DIAGNOSIS — Z96651 Presence of right artificial knee joint: Secondary | ICD-10-CM
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
## 2019-03-24 ENCOUNTER — Other Ambulatory Visit: Payer: Self-pay | Admitting: Family Medicine

## 2019-03-24 ENCOUNTER — Other Ambulatory Visit: Payer: Self-pay

## 2019-03-24 ENCOUNTER — Ambulatory Visit
Admission: RE | Admit: 2019-03-24 | Discharge: 2019-03-24 | Disposition: A | Discharge status: Home / Self Care | Payer: Medicare Other | Source: Ambulatory Visit | Attending: Family Medicine | Admitting: Family Medicine

## 2019-03-24 DIAGNOSIS — R52 Pain, unspecified: Secondary | ICD-10-CM

## 2019-06-24 ENCOUNTER — Telehealth: Payer: Self-pay | Admitting: Hematology & Oncology

## 2019-06-24 NOTE — Telephone Encounter (Signed)
Spoke with patient to confirm new patient appt date/time/location 10/5 at 1030 am

## 2019-07-03 ENCOUNTER — Other Ambulatory Visit: Payer: Self-pay | Admitting: Family

## 2019-07-06 ENCOUNTER — Other Ambulatory Visit: Payer: Self-pay

## 2019-07-06 ENCOUNTER — Inpatient Hospital Stay: Payer: Medicare Other | Attending: Family | Admitting: Family

## 2019-07-06 ENCOUNTER — Inpatient Hospital Stay: Payer: Medicare Other

## 2019-07-06 ENCOUNTER — Encounter: Payer: Self-pay | Admitting: Family

## 2019-07-06 DIAGNOSIS — Z7982 Long term (current) use of aspirin: Secondary | ICD-10-CM | POA: Insufficient documentation

## 2019-07-06 HISTORY — DX: Hereditary hemochromatosis: E83.110

## 2019-07-06 LAB — CBC WITH DIFFERENTIAL (CANCER CENTER ONLY)
Abs Immature Granulocytes: 0.07 10*3/uL (ref 0.00–0.07)
Basophils Absolute: 0 10*3/uL (ref 0.0–0.1)
Basophils Relative: 0 %
Eosinophils Absolute: 0.2 10*3/uL (ref 0.0–0.5)
Eosinophils Relative: 2 %
HCT: 39.8 % (ref 39.0–52.0)
Hemoglobin: 13.1 g/dL (ref 13.0–17.0)
Immature Granulocytes: 1 %
Lymphocytes Relative: 25 %
Lymphs Abs: 2.4 10*3/uL (ref 0.7–4.0)
MCH: 32.1 pg (ref 26.0–34.0)
MCHC: 32.9 g/dL (ref 30.0–36.0)
MCV: 97.5 fL (ref 80.0–100.0)
Monocytes Absolute: 1.1 10*3/uL — ABNORMAL HIGH (ref 0.1–1.0)
Monocytes Relative: 12 %
Neutro Abs: 5.6 10*3/uL (ref 1.7–7.7)
Neutrophils Relative %: 60 %
Platelet Count: 280 10*3/uL (ref 150–400)
RBC: 4.08 MIL/uL — ABNORMAL LOW (ref 4.22–5.81)
RDW: 11.9 % (ref 11.5–15.5)
WBC Count: 9.3 10*3/uL (ref 4.0–10.5)
nRBC: 0 % (ref 0.0–0.2)

## 2019-07-06 LAB — RETICULOCYTES
Immature Retic Fract: 6.9 % (ref 2.3–15.9)
RBC.: 4.1 MIL/uL — ABNORMAL LOW (ref 4.22–5.81)
Retic Count, Absolute: 55.4 10*3/uL (ref 19.0–186.0)
Retic Ct Pct: 1.4 % (ref 0.4–3.1)

## 2019-07-06 LAB — CMP (CANCER CENTER ONLY)
ALT: 13 U/L (ref 0–44)
AST: 18 U/L (ref 15–41)
Albumin: 4.5 g/dL (ref 3.5–5.0)
Alkaline Phosphatase: 69 U/L (ref 38–126)
Anion gap: 7 (ref 5–15)
BUN: 12 mg/dL (ref 8–23)
CO2: 33 mmol/L — ABNORMAL HIGH (ref 22–32)
Calcium: 9.3 mg/dL (ref 8.9–10.3)
Chloride: 95 mmol/L — ABNORMAL LOW (ref 98–111)
Creatinine: 0.87 mg/dL (ref 0.61–1.24)
GFR, Est AFR Am: >60 mL/min (ref >60)
GFR, Estimated: >60 mL/min (ref >60)
Glucose, Bld: 93 mg/dL (ref 70–99)
Potassium: 3.3 mmol/L — ABNORMAL LOW (ref 3.5–5.1)
Sodium: 135 mmol/L (ref 135–145)
Total Bilirubin: 0.7 mg/dL (ref 0.3–1.2)
Total Protein: 7.2 g/dL (ref 6.5–8.1)

## 2019-07-06 LAB — LACTATE DEHYDROGENASE: LDH: 201 U/L — ABNORMAL HIGH (ref 98–192)
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
## 2019-07-07 LAB — IRON AND TIBC
Iron: 155 ug/dL (ref 42–163)
Saturation Ratios: 54 % (ref 20–55)
TIBC: 290 ug/dL (ref 202–409)
UIBC: 135 ug/dL (ref 117–376)

## 2019-07-07 LAB — FERRITIN: Ferritin: 250 ng/mL (ref 24–336)

## 2019-07-08 ENCOUNTER — Telehealth: Payer: Self-pay | Admitting: Family

## 2019-07-08 NOTE — Telephone Encounter (Signed)
Called and spoke with patient regarding appointments added per 10/7 sch msg

## 2019-07-15 ENCOUNTER — Other Ambulatory Visit: Payer: Self-pay

## 2019-07-15 ENCOUNTER — Inpatient Hospital Stay: Payer: Medicare Other

## 2019-07-15 ENCOUNTER — Other Ambulatory Visit: Payer: Self-pay | Admitting: Family

## 2019-07-15 NOTE — Patient Instructions (Signed)
Therapeutic Phlebotomy Therapeutic phlebotomy is the planned removal of blood from a person's body for the purpose of treating a medical condition. The procedure is similar to donating blood. Usually, about a pint (470 mL, or 0.47 L) of blood is removed. The average adult has 9-12 pints (4.3-5.7 L) of blood in the body. Therapeutic phlebotomy may be used to treat the following medical conditions:  Hemochromatosis. This is a condition in which the blood contains too much iron.  Polycythemia vera. This is a condition in which the blood contains too many red blood cells.  Porphyria cutanea tarda. This is a disease in which an important part of hemoglobin is not made properly. It results in the buildup of abnormal amounts of porphyrins in the body.  Sickle cell disease. This is a condition in which the red blood cells form an abnormal crescent shape rather than a round shape. Tell a health care provider about:  Any allergies you have.  All medicines you are taking, including vitamins, herbs, eye drops, creams, and over-the-counter medicines.  Any problems you or family members have had with anesthetic medicines.  Any blood disorders you have.  Any surgeries you have had.  Any medical conditions you have.  Whether you are pregnant or may be pregnant. What are the risks? Generally, this is a safe procedure. However, problems may occur, including:  Nausea or light-headedness.  Low blood pressure (hypotension).  Soreness, bleeding, swelling, or bruising at the needle insertion site.  Infection. What happens before the procedure?  Follow instructions from your health care provider about eating or drinking restrictions.  Ask your health care provider about: ? Changing or stopping your regular medicines. This is especially important if you are taking diabetes medicines or blood thinners (anticoagulants). ? Taking medicines such as aspirin and ibuprofen. These medicines can thin your  blood. Do not take these medicines unless your health care provider tells you to take them. ? Taking over-the-counter medicines, vitamins, herbs, and supplements.  Wear clothing with sleeves that can be raised above the elbow.  Plan to have someone take you home from the hospital or clinic.  You may have a blood sample taken.  Your blood pressure, pulse rate, and breathing rate will be measured. What happens during the procedure?   To lower your risk of infection: ? Your health care team will wash or sanitize their hands. ? Your skin will be cleaned with an antiseptic.  You may be given a medicine to numb the area (local anesthetic).  A tourniquet will be placed on your arm.  A needle will be inserted into one of your veins.  Tubing and a collection bag will be attached to that needle.  Blood will flow through the needle and tubing into the collection bag.  The collection bag will be placed lower than your arm to allow gravity to help the flow of blood into the bag.  You may be asked to open and close your hand slowly and continually during the entire collection.  After the specified amount of blood has been removed from your body, the collection bag and tubing will be clamped.  The needle will be removed from your vein.  Pressure will be held on the site of the needle insertion to stop the bleeding.  A bandage (dressing) will be placed over the needle insertion site. The procedure may vary among health care providers and hospitals. What happens after the procedure?  Your blood pressure, pulse rate, and breathing rate will be   measured after the procedure.  You will be encouraged to drink fluids.  Your recovery will be assessed and monitored.  You can return to your normal activities as told by your health care provider. Summary  Therapeutic phlebotomy is the planned removal of blood from a person's body for the purpose of treating a medical condition.  Therapeutic  phlebotomy may be used to treat hemochromatosis, polycythemia vera, porphyria cutanea tarda, or sickle cell disease.  In the procedure, a needle is inserted and about a pint (470 mL, or 0.47 L) of blood is removed. The average adult has 9-12 pints (4.3-5.7 L) of blood in the body.  This is generally a safe procedure, but it can sometimes cause problems such as nausea, light-headedness, or low blood pressure (hypotension). This information is not intended to replace advice given to you by your health care provider. Make sure you discuss any questions you have with your health care provider. Document Released: 02/19/2011 Document Revised: 10/03/2017 Document Reviewed: 10/03/2017 Elsevier Patient Education  2020 Elsevier Inc.  

## 2019-07-15 NOTE — Progress Notes (Signed)
Corey Carter presents today for phlebotomy per MD orders. Phlebotomy procedure started at 1430 and ended at 1437. 537 grams removed via 16 gauge needle to left AC. Patient observed for 30 minutes after procedure without any incident. Patient tolerated procedure well. IV needle removed intact. Pt given post procedure snack and drink.

## 2019-07-20 ENCOUNTER — Telehealth: Payer: Self-pay | Admitting: *Deleted

## 2019-07-20 NOTE — Telephone Encounter (Signed)
Message received from patient's wife requesting a call back regarding upcoming appointments.  Call placed back to patient's wife and upcoming appointments reviewed with her.  Pt.'s wife appreciative of call back and has no further questions or concerns at this time.

## 2019-07-29 ENCOUNTER — Inpatient Hospital Stay: Payer: Medicare Other

## 2019-07-29 ENCOUNTER — Other Ambulatory Visit: Payer: Self-pay

## 2019-07-29 NOTE — Patient Instructions (Signed)

## 2019-07-29 NOTE — Progress Notes (Signed)
Corey Carter presents today for phlebotomy per MD orders. Phlebotomy procedure started at 1401 and ended at 1411. 516 cc removed via 16 G needle at L antecubital site. Patient tolerated procedure well.

## 2019-08-04 ENCOUNTER — Other Ambulatory Visit: Payer: Self-pay | Admitting: Family

## 2019-08-05 ENCOUNTER — Inpatient Hospital Stay: Payer: Medicare Other | Attending: Family

## 2019-08-05 ENCOUNTER — Telehealth: Payer: Self-pay | Admitting: Family

## 2019-08-05 ENCOUNTER — Other Ambulatory Visit: Payer: Self-pay

## 2019-08-05 ENCOUNTER — Inpatient Hospital Stay (HOSPITAL_BASED_OUTPATIENT_CLINIC_OR_DEPARTMENT_OTHER): Payer: Medicare Other | Admitting: Family

## 2019-08-05 DIAGNOSIS — Z7982 Long term (current) use of aspirin: Secondary | ICD-10-CM | POA: Diagnosis not present

## 2019-08-05 LAB — CBC WITH DIFFERENTIAL (CANCER CENTER ONLY)
Abs Immature Granulocytes: 0.02 10*3/uL (ref 0.00–0.07)
Basophils Absolute: 0 10*3/uL (ref 0.0–0.1)
Basophils Relative: 0 %
Eosinophils Absolute: 0.1 10*3/uL (ref 0.0–0.5)
Eosinophils Relative: 1 %
HCT: 38.7 % — ABNORMAL LOW (ref 39.0–52.0)
Hemoglobin: 12.8 g/dL — ABNORMAL LOW (ref 13.0–17.0)
Immature Granulocytes: 0 %
Lymphocytes Relative: 31 %
Lymphs Abs: 2.3 10*3/uL (ref 0.7–4.0)
MCH: 31.7 pg (ref 26.0–34.0)
MCHC: 33.1 g/dL (ref 30.0–36.0)
MCV: 95.8 fL (ref 80.0–100.0)
Monocytes Absolute: 1 10*3/uL (ref 0.1–1.0)
Monocytes Relative: 14 %
Neutro Abs: 3.9 10*3/uL (ref 1.7–7.7)
Neutrophils Relative %: 54 %
Platelet Count: 304 10*3/uL (ref 150–400)
RBC: 4.04 MIL/uL — ABNORMAL LOW (ref 4.22–5.81)
RDW: 11.9 % (ref 11.5–15.5)
WBC Count: 7.4 10*3/uL (ref 4.0–10.5)
nRBC: 0 % (ref 0.0–0.2)

## 2019-08-05 LAB — CMP (CANCER CENTER ONLY)
ALT: 15 U/L (ref 0–44)
AST: 21 U/L (ref 15–41)
Albumin: 4.7 g/dL (ref 3.5–5.0)
Alkaline Phosphatase: 63 U/L (ref 38–126)
Anion gap: 9 (ref 5–15)
BUN: 16 mg/dL (ref 8–23)
CO2: 30 mmol/L (ref 22–32)
Calcium: 9.4 mg/dL (ref 8.9–10.3)
Chloride: 100 mmol/L (ref 98–111)
Creatinine: 1.26 mg/dL — ABNORMAL HIGH (ref 0.61–1.24)
GFR, Est AFR Am: >60 mL/min (ref >60)
GFR, Estimated: 56 mL/min — ABNORMAL LOW (ref >60)
Glucose, Bld: 138 mg/dL — ABNORMAL HIGH (ref 70–99)
Potassium: 3.4 mmol/L — ABNORMAL LOW (ref 3.5–5.1)
Sodium: 139 mmol/L (ref 135–145)
Total Bilirubin: 0.4 mg/dL (ref 0.3–1.2)
Total Protein: 7.1 g/dL (ref 6.5–8.1)

## 2019-08-05 NOTE — Progress Notes (Signed)
Hematology and Oncology Follow Up Visit  Corey Carter [id number] 1945-10-18 73 y.o. 08/05/2019   Principle Diagnosis:  Hemochromatosis, Homozygous for the H63D mutation  Current Therapy:   Phlebotomy to maintain ferritin < 100 and iron saturation < 50% Aspirin 325 mg PO daily    Interim History:  Corey Carter is here today for follow-up. He has had 2 phlebotomies over the last month and is doing well. He tolerated these treatments nicely and has no complaints.  His dizziness has resolved.  He denies fatigue. No fever, chills, n/v, cough, rash, dizziness, SOB, chest pain, palpitations, abdominal pain or changes in bowel or bladder habits.  No swelling, tenderness, numbness or tingling in his extremities.  No falls or syncope.  He is eating well and staying well hydrated. His weight is stable.  No bleeding, bruising or petechiae.   ECOG Performance Status: 1 - Symptomatic but completely ambulatory  Medications:  Allergies as of 08/05/2019      Reactions   Penicillins Swelling   "SEVERE SWELLING OF JOINTS" PATIENT HAS HAD A PCN REACTION WITH IMMEDIATE RASH, FACIAL/TONGUE/THROAT SWELLING, SOB, OR LIGHTHEADEDNESS WITH HYPOTENSION:  #  #  #  YES  #  #  #   Has patient had a PCN reaction causing severe rash involving mucus membranes or skin necrosis: no PATIENT HAS HAD A PCN REACTION THAT REQUIRED HOSPITALIZATION:  #  #  #  YES  #  #  #   Has patient had a PCN reaction occurring within the last 10 years: no   Codeine    "my wife says I'm allergic to this" pt unsure of a reaction that he has      Medication List       Accurate as of August 05, 2019 11:00 AM. If you have any questions, ask your nurse or doctor.        aspirin EC 325 MG tablet Take 1 tablet (325 mg total) by mouth 2 (two) times daily.   cholecalciferol 1000 units tablet Commonly known as: VITAMIN D Take 1,000 Units by mouth every other day.   HYDROcodone-acetaminophen 10-325 MG tablet Commonly known as: NORCO  Take 1 tablet by mouth every 4 (four) hours as needed for moderate pain.   methocarbamol 750 MG tablet Commonly known as: ROBAXIN Take 1 tablet (750 mg total) by mouth 2 (two) times daily as needed for muscle spasms.   MIDNITE PM PO Take 1 tablet by mouth at bedtime as needed (sleep).   ondansetron 4 MG tablet Commonly known as: ZOFRAN Take 1-2 tablets (4-8 mg total) by mouth every 8 (eight) hours as needed for nausea or vomiting.   promethazine 25 MG tablet Commonly known as: PHENERGAN Take 1 tablet (25 mg total) by mouth every 6 (six) hours as needed for nausea.   senna-docusate 8.6-50 MG tablet Commonly known as: Senokot S Take 1 tablet by mouth at bedtime as needed.   traZODone 150 MG tablet Commonly known as: DESYREL       Allergies:  Allergies  Allergen Reactions  . Penicillins Swelling    "SEVERE SWELLING OF JOINTS"  PATIENT HAS HAD A PCN REACTION WITH IMMEDIATE RASH, FACIAL/TONGUE/THROAT SWELLING, SOB, OR LIGHTHEADEDNESS WITH HYPOTENSION:  #  #  #  YES  #  #  #   Has patient had a PCN reaction causing severe rash involving mucus membranes or skin necrosis: no PATIENT HAS HAD A PCN REACTION THAT REQUIRED HOSPITALIZATION:  #  #  #  YES  #  #  #  Has patient had a PCN reaction occurring within the last 10 years: no   . Codeine     "my wife says I'm allergic to this" pt unsure of a reaction that he has    Past Medical History, Surgical history, Social history, and Family History were reviewed and updated.  Review of Systems: All other 10 point review of systems is negative.   Physical Exam:  vitals were not taken for this visit.   Wt Readings from Last 3 Encounters:  07/06/19 186 lb (84.4 kg)  04/09/17 181 lb (82.1 kg)  03/15/16 185 lb (83.9 kg)    Ocular: Sclerae unicteric, pupils equal, round and reactive to light Ear-nose-throat: Oropharynx clear, dentition fair Lymphatic: No cervical or supraclavicular adenopathy Lungs no rales or rhonchi, good  excursion bilaterally Heart regular rate and rhythm, no murmur appreciated Abd soft, nontender, positive bowel sounds, no liver or spleen tip palpated on exam, no fluid wave  MSK no focal spinal tenderness, no joint edema Neuro: non-focal, well-oriented, appropriate affect Breasts: Deferred   Lab Results  Component Value Date   WBC 7.4 08/05/2019   HGB 12.8 (L) 08/05/2019   HCT 38.7 (L) 08/05/2019   MCV 95.8 08/05/2019   PLT 304 08/05/2019   Lab Results  Component Value Date   FERRITIN 250 07/06/2019   IRON 155 07/06/2019   TIBC 290 07/06/2019   UIBC 135 07/06/2019   IRONPCTSAT 54 07/06/2019   Lab Results  Component Value Date   RETICCTPCT 1.4 07/06/2019   RBC 4.04 (L) 08/05/2019   No results found for: KPAFRELGTCHN, LAMBDASER, KAPLAMBRATIO No results found for: IGGSERUM, IGA, IGMSERUM No results found for: Marda Stalker, SPEI   Chemistry      Component Value Date/Time   NA 135 07/06/2019 1023   K 3.3 (L) 07/06/2019 1023   CL 95 (L) 07/06/2019 1023   CO2 33 (H) 07/06/2019 1023   BUN 12 07/06/2019 1023   CREATININE 0.87 07/06/2019 1023      Component Value Date/Time   CALCIUM 9.3 07/06/2019 1023   ALKPHOS 69 07/06/2019 1023   AST 18 07/06/2019 1023   ALT 13 07/06/2019 1023   BILITOT 0.7 07/06/2019 1023       Impression and Plan: Corey Carter is a very pleasant 73 yo caucasian gentleman with history of hemochromatosis, homozygous for the H63D mutation.  We will see what his iron studies look like and determine if we need to schedule another phlebotomy.  We will go ahead and plan to see him back in 8 weeks.  He will contact our office with any questions or concerns. We can certainly see him sooner if needed.   Emeline Gins, NP 11/4/202011:00 AM

## 2019-08-05 NOTE — Telephone Encounter (Signed)
Appointments scheduled calendar / letter mailed and patient notified per 11/4 los

## 2019-08-06 LAB — IRON AND TIBC
Iron: 75 ug/dL (ref 42–163)
Saturation Ratios: 26 % (ref 20–55)
TIBC: 294 ug/dL (ref 202–409)
UIBC: 218 ug/dL (ref 117–376)

## 2019-08-06 LAB — FERRITIN: Ferritin: 143 ng/mL (ref 24–336)

## 2019-08-12 ENCOUNTER — Inpatient Hospital Stay: Payer: Medicare Other

## 2019-08-12 ENCOUNTER — Other Ambulatory Visit: Payer: Self-pay

## 2019-08-12 NOTE — Progress Notes (Signed)
Draycen Leichter presents today for phlebotomy per MD orders. Phlebotomy procedure started at 1011 and ended at 1018. 535 cc removed via 16 G needle at L antecubital site. Patient tolerated procedure well. Refused to wait 30 minutes post phlebotomy.

## 2019-08-12 NOTE — Patient Instructions (Signed)

## 2019-09-09 ENCOUNTER — Other Ambulatory Visit: Payer: Self-pay

## 2019-09-09 ENCOUNTER — Inpatient Hospital Stay: Payer: Medicare Other | Attending: Family

## 2019-09-09 DIAGNOSIS — Z7982 Long term (current) use of aspirin: Secondary | ICD-10-CM | POA: Insufficient documentation

## 2019-09-09 NOTE — Patient Instructions (Signed)

## 2019-09-30 ENCOUNTER — Inpatient Hospital Stay (HOSPITAL_BASED_OUTPATIENT_CLINIC_OR_DEPARTMENT_OTHER): Payer: Medicare Other | Admitting: Family

## 2019-09-30 ENCOUNTER — Other Ambulatory Visit: Payer: Self-pay

## 2019-09-30 ENCOUNTER — Inpatient Hospital Stay: Payer: Medicare Other

## 2019-09-30 LAB — CBC WITH DIFFERENTIAL (CANCER CENTER ONLY)
Abs Immature Granulocytes: 0.03 10*3/uL (ref 0.00–0.07)
Basophils Absolute: 0 10*3/uL (ref 0.0–0.1)
Basophils Relative: 0 %
Eosinophils Absolute: 0.1 10*3/uL (ref 0.0–0.5)
Eosinophils Relative: 1 %
HCT: 36.5 % — ABNORMAL LOW (ref 39.0–52.0)
Hemoglobin: 11.8 g/dL — ABNORMAL LOW (ref 13.0–17.0)
Immature Granulocytes: 0 %
Lymphocytes Relative: 12 %
Lymphs Abs: 1.1 10*3/uL (ref 0.7–4.0)
MCH: 31.6 pg (ref 26.0–34.0)
MCHC: 32.3 g/dL (ref 30.0–36.0)
MCV: 97.9 fL (ref 80.0–100.0)
Monocytes Absolute: 1.6 10*3/uL — ABNORMAL HIGH (ref 0.1–1.0)
Monocytes Relative: 17 %
Neutro Abs: 6.6 10*3/uL (ref 1.7–7.7)
Neutrophils Relative %: 70 %
Platelet Count: 254 10*3/uL (ref 150–400)
RBC: 3.73 MIL/uL — ABNORMAL LOW (ref 4.22–5.81)
RDW: 12.1 % (ref 11.5–15.5)
WBC Count: 9.4 10*3/uL (ref 4.0–10.5)
nRBC: 0 % (ref 0.0–0.2)

## 2019-09-30 LAB — CMP (CANCER CENTER ONLY)
ALT: 25 U/L (ref 0–44)
AST: 39 U/L (ref 15–41)
Albumin: 4.1 g/dL (ref 3.5–5.0)
Alkaline Phosphatase: 59 U/L (ref 38–126)
Anion gap: 6 (ref 5–15)
BUN: 10 mg/dL (ref 8–23)
CO2: 31 mmol/L (ref 22–32)
Calcium: 9.1 mg/dL (ref 8.9–10.3)
Chloride: 101 mmol/L (ref 98–111)
Creatinine: 0.96 mg/dL (ref 0.61–1.24)
GFR, Est AFR Am: >60 mL/min (ref >60)
GFR, Estimated: >60 mL/min (ref >60)
Glucose, Bld: 85 mg/dL (ref 70–99)
Potassium: 3.8 mmol/L (ref 3.5–5.1)
Sodium: 138 mmol/L (ref 135–145)
Total Bilirubin: 0.4 mg/dL (ref 0.3–1.2)
Total Protein: 6.7 g/dL (ref 6.5–8.1)

## 2019-09-30 LAB — LACTATE DEHYDROGENASE: LDH: 276 U/L — ABNORMAL HIGH (ref 98–192)

## 2019-09-30 NOTE — Progress Notes (Signed)
Hematology and Oncology Follow Up Visit  Corey Carter [id number] 1945-11-29 73 y.o. 09/30/2019   Principle Diagnosis:  Hemochromatosis, Homozygous for the H63D mutation  Current Therapy:   Phlebotomy to maintain ferritin < 100 and iron saturation < 50% Aspirin 325 mg PO daily    Interim History:  Corey Carter is here today for follow-up. He is doing well and has no complaints other than some loss of hearing in the right ear. He plans to follow-up with his PCP for further work-up. Hct is 36.5. Iron studies are pending.  He denies fatigue at this time.  No fever, chills, n/v, cough, rash, dizziness, SOB, chest pain, palpitations, abdominal pain or changes in bowel or bladder habits.  No swelling, tenderness, numbness or tingling in her extremities.  No falls or syncope.  He is eating well and staying hydrated. His weight is stable.   ECOG Performance Status: 0 - Asymptomatic  Medications:  Allergies as of 09/30/2019      Reactions   Penicillins Swelling   "SEVERE SWELLING OF JOINTS" PATIENT HAS HAD A PCN REACTION WITH IMMEDIATE RASH, FACIAL/TONGUE/THROAT SWELLING, SOB, OR LIGHTHEADEDNESS WITH HYPOTENSION:  #  #  #  YES  #  #  #   Has patient had a PCN reaction causing severe rash involving mucus membranes or skin necrosis: no PATIENT HAS HAD A PCN REACTION THAT REQUIRED HOSPITALIZATION:  #  #  #  YES  #  #  #   Has patient had a PCN reaction occurring within the last 10 years: no   Codeine    "my wife says I'm allergic to this" pt unsure of a reaction that he has      Medication List       Accurate as of September 30, 2019 11:36 AM. If you have any questions, ask your nurse or doctor.        aspirin EC 81 MG tablet Take 81 mg by mouth daily.   famotidine 20 MG tablet Commonly known as: PEPCID   HYDROcodone-acetaminophen 10-325 MG tablet Commonly known as: NORCO Take 1 tablet by mouth every 4 (four) hours as needed for moderate pain.   methocarbamol 750 MG  tablet Commonly known as: ROBAXIN Take 1 tablet (750 mg total) by mouth 2 (two) times daily as needed for muscle spasms.   MIDNITE PM PO Take 1 tablet by mouth at bedtime as needed (sleep).   ondansetron 4 MG tablet Commonly known as: ZOFRAN Take 1-2 tablets (4-8 mg total) by mouth every 8 (eight) hours as needed for nausea or vomiting.   promethazine 25 MG tablet Commonly known as: PHENERGAN Take 1 tablet (25 mg total) by mouth every 6 (six) hours as needed for nausea.   senna-docusate 8.6-50 MG tablet Commonly known as: Senokot S Take 1 tablet by mouth at bedtime as needed.   traZODone 150 MG tablet Commonly known as: DESYREL       Allergies:  Allergies  Allergen Reactions  . Penicillins Swelling    "SEVERE SWELLING OF JOINTS"  PATIENT HAS HAD A PCN REACTION WITH IMMEDIATE RASH, FACIAL/TONGUE/THROAT SWELLING, SOB, OR LIGHTHEADEDNESS WITH HYPOTENSION:  #  #  #  YES  #  #  #   Has patient had a PCN reaction causing severe rash involving mucus membranes or skin necrosis: no PATIENT HAS HAD A PCN REACTION THAT REQUIRED HOSPITALIZATION:  #  #  #  YES  #  #  #   Has patient had a PCN reaction  occurring within the last 10 years: no   . Codeine     "my wife says I'm allergic to this" pt unsure of a reaction that he has    Past Medical History, Surgical history, Social history, and Family History were reviewed and updated.  Review of Systems: All other 10 point review of systems is negative.   Physical Exam:  temporal temperature is 98.5 F (36.9 C). His blood pressure is 153/77 (abnormal) and his pulse is 94. His respiration is 16 and oxygen saturation is 99%.   Wt Readings from Last 3 Encounters:  08/05/19 178 lb 8 oz (81 kg)  07/06/19 186 lb (84.4 kg)  04/09/17 181 lb (82.1 kg)    Ocular: Sclerae unicteric, pupils equal, round and reactive to light Ear-nose-throat: Oropharynx clear, dentition fair Lymphatic: No cervical or supraclavicular adenopathy Lungs no  rales or rhonchi, good excursion bilaterally Heart regular rate and rhythm, no murmur appreciated Abd soft, nontender, positive bowel sounds, no liver or spleen tip palpated on exam, no fluid wave  MSK no focal spinal tenderness, no joint edema Neuro: non-focal, well-oriented, appropriate affect Breasts: Deferred   Lab Results  Component Value Date   WBC 9.4 09/30/2019   HGB 11.8 (L) 09/30/2019   HCT 36.5 (L) 09/30/2019   MCV 97.9 09/30/2019   PLT 254 09/30/2019   Lab Results  Component Value Date   FERRITIN 143 08/05/2019   IRON 75 08/05/2019   TIBC 294 08/05/2019   UIBC 218 08/05/2019   IRONPCTSAT 26 08/05/2019   Lab Results  Component Value Date   RETICCTPCT 1.4 07/06/2019   RBC 3.73 (L) 09/30/2019   No results found for: KPAFRELGTCHN, LAMBDASER, KAPLAMBRATIO No results found for: IGGSERUM, IGA, IGMSERUM No results found for: Marda Stalker, SPEI   Chemistry      Component Value Date/Time   NA 139 08/05/2019 1044   K 3.4 (L) 08/05/2019 1044   CL 100 08/05/2019 1044   CO2 30 08/05/2019 1044   BUN 16 08/05/2019 1044   CREATININE 1.26 (H) 08/05/2019 1044      Component Value Date/Time   CALCIUM 9.4 08/05/2019 1044   ALKPHOS 63 08/05/2019 1044   AST 21 08/05/2019 1044   ALT 15 08/05/2019 1044   BILITOT 0.4 08/05/2019 1044       Impression and Plan: Corey Carter is a very pleasant 73 yo caucasian gentleman with history of hemochromatosis, homozygous for the H63D mutation.  We will see what his iron studies show and bring him back in for phlebotomy if needed.  We will plan to see him back in another 6 weeks.  He will contact our office with any questions or concerns. We can certainly see her sooner if needed.   Emeline Gins, NP 12/30/202011:36 AM

## 2019-10-01 LAB — IRON AND TIBC
Iron: 24 ug/dL — ABNORMAL LOW (ref 42–163)
Saturation Ratios: 8 % — ABNORMAL LOW (ref 20–55)
TIBC: 284 ug/dL (ref 202–409)
UIBC: 260 ug/dL (ref 117–376)

## 2019-10-01 LAB — FERRITIN: Ferritin: 99 ng/mL (ref 24–336)

## 2019-10-07 ENCOUNTER — Inpatient Hospital Stay: Payer: Medicare Other

## 2019-10-07 ENCOUNTER — Telehealth: Payer: Self-pay | Admitting: Hematology & Oncology

## 2019-10-07 NOTE — Telephone Encounter (Signed)
Called and spoke with patient about r/s his appt for 1/6 until 1/13.  He was with new date/time per 1/6 sch msg

## 2019-10-14 ENCOUNTER — Other Ambulatory Visit: Payer: Self-pay

## 2019-10-14 ENCOUNTER — Inpatient Hospital Stay: Payer: Medicare Other | Attending: Family

## 2019-10-14 NOTE — Progress Notes (Signed)
Patient was not due for phlebotomy today.

## 2019-11-11 ENCOUNTER — Encounter: Payer: Self-pay | Admitting: Family

## 2019-11-11 ENCOUNTER — Inpatient Hospital Stay: Payer: Medicare Other | Attending: Family | Admitting: Family

## 2019-11-11 ENCOUNTER — Inpatient Hospital Stay: Payer: Medicare Other

## 2019-11-11 ENCOUNTER — Other Ambulatory Visit: Payer: Self-pay

## 2019-11-11 ENCOUNTER — Telehealth: Payer: Self-pay | Admitting: Family

## 2019-11-11 DIAGNOSIS — Z7982 Long term (current) use of aspirin: Secondary | ICD-10-CM | POA: Diagnosis not present

## 2019-11-11 LAB — CBC WITH DIFFERENTIAL (CANCER CENTER ONLY)
Abs Immature Granulocytes: 0.03 10*3/uL (ref 0.00–0.07)
Basophils Absolute: 0.1 10*3/uL (ref 0.0–0.1)
Basophils Relative: 1 %
Eosinophils Absolute: 0.1 10*3/uL (ref 0.0–0.5)
Eosinophils Relative: 2 %
HCT: 39.8 % (ref 39.0–52.0)
Hemoglobin: 12.9 g/dL — ABNORMAL LOW (ref 13.0–17.0)
Immature Granulocytes: 0 %
Lymphocytes Relative: 32 %
Lymphs Abs: 2.5 10*3/uL (ref 0.7–4.0)
MCH: 30.6 pg (ref 26.0–34.0)
MCHC: 32.4 g/dL (ref 30.0–36.0)
MCV: 94.3 fL (ref 80.0–100.0)
Monocytes Absolute: 1.1 10*3/uL — ABNORMAL HIGH (ref 0.1–1.0)
Monocytes Relative: 14 %
Neutro Abs: 4.2 10*3/uL (ref 1.7–7.7)
Neutrophils Relative %: 51 %
Platelet Count: 302 10*3/uL (ref 150–400)
RBC: 4.22 MIL/uL (ref 4.22–5.81)
RDW: 12.5 % (ref 11.5–15.5)
WBC Count: 8 10*3/uL (ref 4.0–10.5)
nRBC: 0 % (ref 0.0–0.2)

## 2019-11-11 LAB — CMP (CANCER CENTER ONLY)
ALT: 11 U/L (ref 0–44)
AST: 16 U/L (ref 15–41)
Albumin: 4.2 g/dL (ref 3.5–5.0)
Alkaline Phosphatase: 56 U/L (ref 38–126)
Anion gap: 7 (ref 5–15)
BUN: 12 mg/dL (ref 8–23)
CO2: 32 mmol/L (ref 22–32)
Calcium: 9.3 mg/dL (ref 8.9–10.3)
Chloride: 99 mmol/L (ref 98–111)
Creatinine: 0.89 mg/dL (ref 0.61–1.24)
GFR, Est AFR Am: >60 mL/min (ref >60)
GFR, Estimated: >60 mL/min (ref >60)
Glucose, Bld: 112 mg/dL — ABNORMAL HIGH (ref 70–99)
Potassium: 4.1 mmol/L (ref 3.5–5.1)
Sodium: 138 mmol/L (ref 135–145)
Total Bilirubin: 0.5 mg/dL (ref 0.3–1.2)
Total Protein: 6.6 g/dL (ref 6.5–8.1)

## 2019-11-11 NOTE — Progress Notes (Signed)
Hematology and Oncology Follow Up Visit  Corey Carter [id number] [birth year] 74 y.o. 11/11/2019   Principle Diagnosis:  Hemochromatosis, Homozygous for the H63D mutation  Current Therapy:   Phlebotomy to maintain ferritin < 100 and iron saturation < 50% Aspirin 325 mg PO daily   Interim History:  Corey Carter is here today for follow-up. He is doing well and has no complaints at this time. He has chronic lower back pain due to a previous injury.  Hct is stable at 39.8. Iron studies are pending.  No fever, chills, n/v, cough, rash, dizziness, SOB, chest pain, palpitations, abdominal pain or changes in bowel or bladder habits.  No swelling, tenderness, numbness or tingling in her extremities.  No falls or syncopal episodes.  He has maintained a good appetite and is staying well hydrated. His weight is stable.   ECOG Performance Status: 1 - Symptomatic but completely ambulatory  Medications:  Allergies as of 11/11/2019      Reactions   Penicillins Swelling   "SEVERE SWELLING OF JOINTS" PATIENT HAS HAD A PCN REACTION WITH IMMEDIATE RASH, FACIAL/TONGUE/THROAT SWELLING, SOB, OR LIGHTHEADEDNESS WITH HYPOTENSION:  #  #  #  YES  #  #  #   Has patient had a PCN reaction causing severe rash involving mucus membranes or skin necrosis: no PATIENT HAS HAD A PCN REACTION THAT REQUIRED HOSPITALIZATION:  #  #  #  YES  #  #  #   Has patient had a PCN reaction occurring within the last 10 years: no   Codeine    "my wife says I'm allergic to this" pt unsure of a reaction that he has      Medication List       Accurate as of November 11, 2019 11:44 AM. If you have any questions, ask your nurse or doctor.        aspirin EC 81 MG tablet Take 81 mg by mouth daily.   famotidine 20 MG tablet Commonly known as: PEPCID   HYDROcodone-acetaminophen 10-325 MG tablet Commonly known as: NORCO Take 1 tablet by mouth every 4 (four) hours as needed for moderate pain.   methocarbamol 750 MG  tablet Commonly known as: ROBAXIN Take 1 tablet (750 mg total) by mouth 2 (two) times daily as needed for muscle spasms.   MIDNITE PM PO Take 1 tablet by mouth at bedtime as needed (sleep).   ondansetron 4 MG tablet Commonly known as: ZOFRAN Take 1-2 tablets (4-8 mg total) by mouth every 8 (eight) hours as needed for nausea or vomiting.   promethazine 25 MG tablet Commonly known as: PHENERGAN Take 1 tablet (25 mg total) by mouth every 6 (six) hours as needed for nausea.   senna-docusate 8.6-50 MG tablet Commonly known as: Senokot S Take 1 tablet by mouth at bedtime as needed.   traZODone 150 MG tablet Commonly known as: DESYREL       Allergies:  Allergies  Allergen Reactions  . Penicillins Swelling    "SEVERE SWELLING OF JOINTS"  PATIENT HAS HAD A PCN REACTION WITH IMMEDIATE RASH, FACIAL/TONGUE/THROAT SWELLING, SOB, OR LIGHTHEADEDNESS WITH HYPOTENSION:  #  #  #  YES  #  #  #   Has patient had a PCN reaction causing severe rash involving mucus membranes or skin necrosis: no PATIENT HAS HAD A PCN REACTION THAT REQUIRED HOSPITALIZATION:  #  #  #  YES  #  #  #   Has patient had a PCN reaction occurring within the  last 10 years: no   . Codeine     "my wife says I'm allergic to this" pt unsure of a reaction that he has    Past Medical History, Surgical history, Social history, and Family History were reviewed and updated.  Review of Systems: All other 10 point review of systems is negative.   Physical Exam:  vitals were not taken for this visit.   Wt Readings from Last 3 Encounters:  08/05/19 178 lb 8 oz (81 kg)  07/06/19 186 lb (84.4 kg)  04/09/17 181 lb (82.1 kg)    Ocular: Sclerae unicteric, pupils equal, round and reactive to light Ear-nose-throat: Oropharynx clear, dentition fair Lymphatic: No cervical or supraclavicular adenopathy Lungs no rales or rhonchi, good excursion bilaterally Heart regular rate and rhythm, no murmur appreciated Abd soft, nontender,  positive bowel sounds, no liver or spleen tip palpated on exam, no fluid wave  MSK no focal spinal tenderness, no joint edema Neuro: non-focal, well-oriented, appropriate affect Breasts: Deferred   Lab Results  Component Value Date   WBC 8.0 11/11/2019   HGB 12.9 (L) 11/11/2019   HCT 39.8 11/11/2019   MCV 94.3 11/11/2019   PLT 302 11/11/2019   Lab Results  Component Value Date   FERRITIN 99 09/30/2019   IRON 24 (L) 09/30/2019   TIBC 284 09/30/2019   UIBC 260 09/30/2019   IRONPCTSAT 8 (L) 09/30/2019   Lab Results  Component Value Date   RETICCTPCT 1.4 07/06/2019   RBC 4.22 11/11/2019   No results found for: KPAFRELGTCHN, LAMBDASER, KAPLAMBRATIO No results found for: IGGSERUM, IGA, IGMSERUM No results found for: Odetta Pink, SPEI   Chemistry      Component Value Date/Time   NA 138 11/11/2019 1047   K 4.1 11/11/2019 1047   CL 99 11/11/2019 1047   CO2 32 11/11/2019 1047   BUN 12 11/11/2019 1047   CREATININE 0.89 11/11/2019 1047      Component Value Date/Time   CALCIUM 9.3 11/11/2019 1047   ALKPHOS 56 11/11/2019 1047   AST 16 11/11/2019 1047   ALT 11 11/11/2019 1047   BILITOT 0.5 11/11/2019 1047       Impression and Plan: Corey Carter is a very pleasant 74 yo caucasian gentleman with history of hemochromatosis, homozygous for the H63D mutation. We will see what his iron studies show and bring him back in for phlebotomy if needed.  We will see him back in another 6 weeks.  He will contact our office with any questions or concerns. We can certainly see him sooner if needed.   Corey Peace, NP 2/10/202111:44 AM

## 2019-11-11 NOTE — Telephone Encounter (Signed)
Called and spoke with patient regarding appointments added per 2/10 los

## 2019-11-12 LAB — IRON AND TIBC
Iron: 111 ug/dL (ref 42–163)
Saturation Ratios: 37 % (ref 20–55)
TIBC: 299 ug/dL (ref 202–409)
UIBC: 188 ug/dL (ref 117–376)

## 2019-11-12 LAB — FERRITIN: Ferritin: 67 ng/mL (ref 24–336)

## 2019-11-25 ENCOUNTER — Ambulatory Visit: Payer: Medicare Other | Admitting: Family

## 2019-11-25 ENCOUNTER — Other Ambulatory Visit: Payer: Medicare Other

## 2019-12-23 ENCOUNTER — Inpatient Hospital Stay (HOSPITAL_BASED_OUTPATIENT_CLINIC_OR_DEPARTMENT_OTHER): Payer: Medicare HMO | Admitting: Family

## 2019-12-23 ENCOUNTER — Encounter: Payer: Self-pay | Admitting: Family

## 2019-12-23 ENCOUNTER — Inpatient Hospital Stay: Payer: Medicare HMO | Attending: Family

## 2019-12-23 ENCOUNTER — Other Ambulatory Visit: Payer: Self-pay

## 2019-12-23 ENCOUNTER — Telehealth: Payer: Self-pay | Admitting: Hematology & Oncology

## 2019-12-23 DIAGNOSIS — Z7982 Long term (current) use of aspirin: Secondary | ICD-10-CM | POA: Diagnosis not present

## 2019-12-23 LAB — CBC WITH DIFFERENTIAL (CANCER CENTER ONLY)
Abs Immature Granulocytes: 0.06 10*3/uL (ref 0.00–0.07)
Basophils Absolute: 0 10*3/uL (ref 0.0–0.1)
Basophils Relative: 0 %
Eosinophils Absolute: 0.1 10*3/uL (ref 0.0–0.5)
Eosinophils Relative: 2 %
HCT: 39.4 % (ref 39.0–52.0)
Hemoglobin: 13.1 g/dL (ref 13.0–17.0)
Immature Granulocytes: 1 %
Lymphocytes Relative: 28 %
Lymphs Abs: 2 10*3/uL (ref 0.7–4.0)
MCH: 31.3 pg (ref 26.0–34.0)
MCHC: 33.2 g/dL (ref 30.0–36.0)
MCV: 94.3 fL (ref 80.0–100.0)
Monocytes Absolute: 0.7 10*3/uL (ref 0.1–1.0)
Monocytes Relative: 10 %
Neutro Abs: 4.3 10*3/uL (ref 1.7–7.7)
Neutrophils Relative %: 59 %
Platelet Count: 220 10*3/uL (ref 150–400)
RBC: 4.18 MIL/uL — ABNORMAL LOW (ref 4.22–5.81)
RDW: 12.9 % (ref 11.5–15.5)
WBC Count: 7.3 10*3/uL (ref 4.0–10.5)
nRBC: 0 % (ref 0.0–0.2)

## 2019-12-23 LAB — CMP (CANCER CENTER ONLY)
ALT: 15 U/L (ref 0–44)
AST: 20 U/L (ref 15–41)
Albumin: 4.2 g/dL (ref 3.5–5.0)
Alkaline Phosphatase: 66 U/L (ref 38–126)
Anion gap: 6 (ref 5–15)
BUN: 16 mg/dL (ref 8–23)
CO2: 32 mmol/L (ref 22–32)
Calcium: 9.2 mg/dL (ref 8.9–10.3)
Chloride: 100 mmol/L (ref 98–111)
Creatinine: 0.9 mg/dL (ref 0.61–1.24)
GFR, Est AFR Am: >60 mL/min (ref >60)
GFR, Estimated: >60 mL/min (ref >60)
Glucose, Bld: 130 mg/dL — ABNORMAL HIGH (ref 70–99)
Potassium: 3.7 mmol/L (ref 3.5–5.1)
Sodium: 138 mmol/L (ref 135–145)
Total Bilirubin: 0.4 mg/dL (ref 0.3–1.2)
Total Protein: 6.8 g/dL (ref 6.5–8.1)

## 2019-12-23 NOTE — Progress Notes (Signed)
Hematology and Oncology Follow Up Visit  Corey Carter [id number] May 05, 1946 74 y.o. 12/23/2019   Principle Diagnosis:  Hemochromatosis, Homozygous for the H63D mutation  Current Therapy:        Phlebotomy to maintain ferritin < 100 and iron saturation < 50% Aspirin 325 mg PO daily   Interim History:  Corey Carter is here today for follow-up. He is doing well and has no complaints at this time.  Iron studies are pending.  No episodes of bleeding. He does bruise easily with aspirin. No petechiae.  No fever, chills, n/v, cough, rash, dizziness, SOB, chest pain, palpitations, abdominal pain or changes in bowel or bladder habits.  No swelling, tenderness, numbness or tingling in his extremities.  No falls or syncope.  He is eating well and staying hydrated. His weight is stable.   ECOG Performance Status: 0 - Asymptomatic  Medications:  Allergies as of 12/23/2019      Reactions   Penicillins Swelling   "SEVERE SWELLING OF JOINTS" PATIENT HAS HAD A PCN REACTION WITH IMMEDIATE RASH, FACIAL/TONGUE/THROAT SWELLING, SOB, OR LIGHTHEADEDNESS WITH HYPOTENSION:  #  #  #  YES  #  #  #   Has patient had a PCN reaction causing severe rash involving mucus membranes or skin necrosis: no PATIENT HAS HAD A PCN REACTION THAT REQUIRED HOSPITALIZATION:  #  #  #  YES  #  #  #   Has patient had a PCN reaction occurring within the last 10 years: no   Codeine    "my wife says I'm allergic to this" pt unsure of a reaction that he has      Medication List       Accurate as of December 23, 2019 11:42 AM. If you have any questions, ask your nurse or doctor.        aspirin EC 81 MG tablet Take 81 mg by mouth daily.   famotidine 20 MG tablet Commonly known as: PEPCID   HYDROcodone-acetaminophen 10-325 MG tablet Commonly known as: NORCO Take 1 tablet by mouth every 4 (four) hours as needed for moderate pain.   methocarbamol 750 MG tablet Commonly known as: ROBAXIN Take 1 tablet (750 mg total) by mouth  2 (two) times daily as needed for muscle spasms.   MIDNITE PM PO Take 1 tablet by mouth at bedtime as needed (sleep).   ondansetron 4 MG tablet Commonly known as: ZOFRAN Take 1-2 tablets (4-8 mg total) by mouth every 8 (eight) hours as needed for nausea or vomiting.   promethazine 25 MG tablet Commonly known as: PHENERGAN Take 1 tablet (25 mg total) by mouth every 6 (six) hours as needed for nausea.   senna-docusate 8.6-50 MG tablet Commonly known as: Senokot S Take 1 tablet by mouth at bedtime as needed.   traZODone 150 MG tablet Commonly known as: DESYREL       Allergies:  Allergies  Allergen Reactions  . Penicillins Swelling    "SEVERE SWELLING OF JOINTS"  PATIENT HAS HAD A PCN REACTION WITH IMMEDIATE RASH, FACIAL/TONGUE/THROAT SWELLING, SOB, OR LIGHTHEADEDNESS WITH HYPOTENSION:  #  #  #  YES  #  #  #   Has patient had a PCN reaction causing severe rash involving mucus membranes or skin necrosis: no PATIENT HAS HAD A PCN REACTION THAT REQUIRED HOSPITALIZATION:  #  #  #  YES  #  #  #   Has patient had a PCN reaction occurring within the last 10 years: no   .  Codeine     "my wife says I'm allergic to this" pt unsure of a reaction that he has    Past Medical History, Surgical history, Social history, and Family History were reviewed and updated.  Review of Systems: All other 10 point review of systems is negative.   Physical Exam:  weight is 185 lb 12 oz (84.3 kg). His temporal temperature is 98.2 F (36.8 C). His blood pressure is 133/49 (abnormal) and his pulse is 71. His respiration is 17 and oxygen saturation is 98%.   Wt Readings from Last 3 Encounters:  12/23/19 185 lb 12 oz (84.3 kg)  11/11/19 182 lb 1.3 oz (82.6 kg)  08/05/19 178 lb 8 oz (81 kg)    Ocular: Sclerae unicteric, pupils equal, round and reactive to light Ear-nose-throat: Oropharynx clear, dentition fair Lymphatic: No cervical or supraclavicular adenopathy Lungs no rales or rhonchi, good  excursion bilaterally Heart regular rate and rhythm, no murmur appreciated Abd soft, nontender, positive bowel sounds, no liver or spleen tip palpated on exam, no fluid wave  MSK no focal spinal tenderness, no joint edema Neuro: non-focal, well-oriented, appropriate affect Breasts: Deferred   Lab Results  Component Value Date   WBC 7.3 12/23/2019   HGB 13.1 12/23/2019   HCT 39.4 12/23/2019   MCV 94.3 12/23/2019   PLT 220 12/23/2019   Lab Results  Component Value Date   FERRITIN 67 11/11/2019   IRON 111 11/11/2019   TIBC 299 11/11/2019   UIBC 188 11/11/2019   IRONPCTSAT 37 11/11/2019   Lab Results  Component Value Date   RETICCTPCT 1.4 07/06/2019   RBC 4.18 (L) 12/23/2019   No results found for: KPAFRELGTCHN, LAMBDASER, KAPLAMBRATIO No results found for: IGGSERUM, IGA, IGMSERUM No results found for: Marda Stalker, SPEI   Chemistry      Component Value Date/Time   NA 138 11/11/2019 1047   K 4.1 11/11/2019 1047   CL 99 11/11/2019 1047   CO2 32 11/11/2019 1047   BUN 12 11/11/2019 1047   CREATININE 0.89 11/11/2019 1047      Component Value Date/Time   CALCIUM 9.3 11/11/2019 1047   ALKPHOS 56 11/11/2019 1047   AST 16 11/11/2019 1047   ALT 11 11/11/2019 1047   BILITOT 0.5 11/11/2019 1047       Impression and Plan: Corey Carter is a very pleasant 74 yo caucasian gentleman with history of hemochromatosis, homozygous for the H63D mutation. Iron studies are pending. We will bring him back in for phlebotomy if needed.  We will plan to see him back in another 6 weeks for lab and 3 months for follow-up.  He will contact our office with any questions or concerns. We can certainly see him sooner if needed.   Emeline Gins, NP 3/24/202111:42 AM

## 2019-12-23 NOTE — Telephone Encounter (Signed)
Called and spoke with patient regarding appointments scheduled.  He was ok with both dates & times per 3/24 los

## 2019-12-24 LAB — IRON AND TIBC
Iron: 102 ug/dL (ref 42–163)
Saturation Ratios: 34 % (ref 20–55)
TIBC: 299 ug/dL (ref 202–409)
UIBC: 196 ug/dL (ref 117–376)

## 2019-12-24 LAB — FERRITIN: Ferritin: 81 ng/mL (ref 24–336)

## 2020-02-03 ENCOUNTER — Inpatient Hospital Stay: Payer: Medicare HMO | Attending: Family

## 2020-02-08 ENCOUNTER — Other Ambulatory Visit: Payer: Self-pay

## 2020-02-08 ENCOUNTER — Inpatient Hospital Stay: Payer: Medicare HMO

## 2020-02-08 LAB — CBC WITH DIFFERENTIAL (CANCER CENTER ONLY)
Abs Immature Granulocytes: 0.02 10*3/uL (ref 0.00–0.07)
Basophils Absolute: 0 10*3/uL (ref 0.0–0.1)
Basophils Relative: 0 %
Eosinophils Absolute: 0.1 10*3/uL (ref 0.0–0.5)
Eosinophils Relative: 1 %
HCT: 37.7 % — ABNORMAL LOW (ref 39.0–52.0)
Hemoglobin: 12.4 g/dL — ABNORMAL LOW (ref 13.0–17.0)
Immature Granulocytes: 0 %
Lymphocytes Relative: 34 %
Lymphs Abs: 2.4 10*3/uL (ref 0.7–4.0)
MCH: 31.6 pg (ref 26.0–34.0)
MCHC: 32.9 g/dL (ref 30.0–36.0)
MCV: 96.2 fL (ref 80.0–100.0)
Monocytes Absolute: 0.8 10*3/uL (ref 0.1–1.0)
Monocytes Relative: 11 %
Neutro Abs: 3.7 10*3/uL (ref 1.7–7.7)
Neutrophils Relative %: 54 %
Platelet Count: 268 10*3/uL (ref 150–400)
RBC: 3.92 MIL/uL — ABNORMAL LOW (ref 4.22–5.81)
RDW: 12.8 % (ref 11.5–15.5)
WBC Count: 6.9 10*3/uL (ref 4.0–10.5)
nRBC: 0 % (ref 0.0–0.2)

## 2020-02-08 LAB — CMP (CANCER CENTER ONLY)
ALT: 13 U/L (ref 0–44)
AST: 20 U/L (ref 15–41)
Albumin: 4.2 g/dL (ref 3.5–5.0)
Alkaline Phosphatase: 52 U/L (ref 38–126)
Anion gap: 6 (ref 5–15)
BUN: 16 mg/dL (ref 8–23)
CO2: 32 mmol/L (ref 22–32)
Calcium: 9.3 mg/dL (ref 8.9–10.3)
Chloride: 101 mmol/L (ref 98–111)
Creatinine: 0.95 mg/dL (ref 0.61–1.24)
GFR, Est AFR Am: >60 mL/min (ref >60)
GFR, Estimated: >60 mL/min (ref >60)
Glucose, Bld: 117 mg/dL — ABNORMAL HIGH (ref 70–99)
Potassium: 3.6 mmol/L (ref 3.5–5.1)
Sodium: 139 mmol/L (ref 135–145)
Total Bilirubin: 0.5 mg/dL (ref 0.3–1.2)
Total Protein: 6.4 g/dL — ABNORMAL LOW (ref 6.5–8.1)

## 2020-02-09 LAB — IRON AND TIBC
Iron: 94 ug/dL (ref 42–163)
Saturation Ratios: 33 % (ref 20–55)
TIBC: 284 ug/dL (ref 202–409)
UIBC: 190 ug/dL (ref 117–376)

## 2020-02-09 LAB — FERRITIN: Ferritin: 94 ng/mL (ref 24–336)

## 2020-03-23 ENCOUNTER — Other Ambulatory Visit: Payer: Self-pay | Admitting: *Deleted

## 2020-03-24 ENCOUNTER — Inpatient Hospital Stay: Payer: Medicare HMO | Attending: Family

## 2020-03-24 ENCOUNTER — Other Ambulatory Visit: Payer: Self-pay

## 2020-03-24 ENCOUNTER — Inpatient Hospital Stay (HOSPITAL_BASED_OUTPATIENT_CLINIC_OR_DEPARTMENT_OTHER): Payer: Medicare HMO | Admitting: Hematology & Oncology

## 2020-03-24 DIAGNOSIS — Z7982 Long term (current) use of aspirin: Secondary | ICD-10-CM | POA: Diagnosis not present

## 2020-03-24 LAB — CMP (CANCER CENTER ONLY)
ALT: 12 U/L (ref 0–44)
AST: 19 U/L (ref 15–41)
Albumin: 4.3 g/dL (ref 3.5–5.0)
Alkaline Phosphatase: 56 U/L (ref 38–126)
Anion gap: 6 (ref 5–15)
BUN: 17 mg/dL (ref 8–23)
CO2: 33 mmol/L — ABNORMAL HIGH (ref 22–32)
Calcium: 9.6 mg/dL (ref 8.9–10.3)
Chloride: 103 mmol/L (ref 98–111)
Creatinine: 0.98 mg/dL (ref 0.61–1.24)
GFR, Est AFR Am: >60 mL/min (ref >60)
GFR, Estimated: >60 mL/min (ref >60)
Glucose, Bld: 111 mg/dL — ABNORMAL HIGH (ref 70–99)
Potassium: 3.8 mmol/L (ref 3.5–5.1)
Sodium: 142 mmol/L (ref 135–145)
Total Bilirubin: 0.4 mg/dL (ref 0.3–1.2)
Total Protein: 7.1 g/dL (ref 6.5–8.1)

## 2020-03-24 LAB — CBC WITH DIFFERENTIAL (CANCER CENTER ONLY)
Abs Immature Granulocytes: 0.02 10*3/uL (ref 0.00–0.07)
Basophils Absolute: 0 10*3/uL (ref 0.0–0.1)
Basophils Relative: 1 %
Eosinophils Absolute: 0.1 10*3/uL (ref 0.0–0.5)
Eosinophils Relative: 1 %
HCT: 42.2 % (ref 39.0–52.0)
Hemoglobin: 13.9 g/dL (ref 13.0–17.0)
Immature Granulocytes: 0 %
Lymphocytes Relative: 29 %
Lymphs Abs: 2.4 10*3/uL (ref 0.7–4.0)
MCH: 32.7 pg (ref 26.0–34.0)
MCHC: 32.9 g/dL (ref 30.0–36.0)
MCV: 99.3 fL (ref 80.0–100.0)
Monocytes Absolute: 0.8 10*3/uL (ref 0.1–1.0)
Monocytes Relative: 10 %
Neutro Abs: 4.9 10*3/uL (ref 1.7–7.7)
Neutrophils Relative %: 59 %
Platelet Count: 264 10*3/uL (ref 150–400)
RBC: 4.25 MIL/uL (ref 4.22–5.81)
RDW: 12.5 % (ref 11.5–15.5)
WBC Count: 8.3 10*3/uL (ref 4.0–10.5)
nRBC: 0 % (ref 0.0–0.2)

## 2020-03-24 NOTE — Progress Notes (Signed)
Hematology and Oncology Follow Up Visit  Corey Carter [id number] Oct 15, 1945 74 y.o. 03/24/2020   Principle Diagnosis:  Hemochromatosis, Homozygous for the H63D mutation  Current Therapy:        Phlebotomy to maintain ferritin < 100 and iron saturation < 50% Aspirin 325 mg PO daily   Interim History:  Corey Carter is here today for follow-up. He is doing well and has no complaints at this time.   He will require visit this summer.  He has quite a few tomato plants that he is awaiting a crop to come out.  I am sure that it will be a bumper crop this year.  Back in May, his ferritin was 94 with an iron saturation of 33%.  We are not having to phlebotomize him really all that much.  He has had no fever.  He has had no change in bowel or bladder habits.  He has had no headache.  Overall, his performance status is ECOG 0   Medications:  Allergies as of 03/24/2020      Reactions   Penicillins Swelling   "SEVERE SWELLING OF JOINTS" PATIENT HAS HAD A PCN REACTION WITH IMMEDIATE RASH, FACIAL/TONGUE/THROAT SWELLING, SOB, OR LIGHTHEADEDNESS WITH HYPOTENSION:  #  #  #  YES  #  #  #   Has patient had a PCN reaction causing severe rash involving mucus membranes or skin necrosis: no PATIENT HAS HAD A PCN REACTION THAT REQUIRED HOSPITALIZATION:  #  #  #  YES  #  #  #   Has patient had a PCN reaction occurring within the last 10 years: no   Codeine    "my wife says I'm allergic to this" pt unsure of a reaction that he has      Medication List       Accurate as of March 24, 2020 12:51 PM. If you have any questions, ask your nurse or doctor.        aspirin EC 81 MG tablet Take 81 mg by mouth daily.   famotidine 20 MG tablet Commonly known as: PEPCID   HYDROcodone-acetaminophen 10-325 MG tablet Commonly known as: NORCO Take 1 tablet by mouth every 4 (four) hours as needed for moderate pain.   methocarbamol 750 MG tablet Commonly known as: ROBAXIN Take 1 tablet (750 mg total) by mouth  2 (two) times daily as needed for muscle spasms.   MIDNITE PM PO Take 1 tablet by mouth at bedtime as needed (sleep).   ondansetron 4 MG tablet Commonly known as: ZOFRAN Take 1-2 tablets (4-8 mg total) by mouth every 8 (eight) hours as needed for nausea or vomiting.   promethazine 25 MG tablet Commonly known as: PHENERGAN Take 1 tablet (25 mg total) by mouth every 6 (six) hours as needed for nausea.   senna-docusate 8.6-50 MG tablet Commonly known as: Senokot S Take 1 tablet by mouth at bedtime as needed.   traZODone 150 MG tablet Commonly known as: DESYREL       Allergies:  Allergies  Allergen Reactions  . Penicillins Swelling    "SEVERE SWELLING OF JOINTS"  PATIENT HAS HAD A PCN REACTION WITH IMMEDIATE RASH, FACIAL/TONGUE/THROAT SWELLING, SOB, OR LIGHTHEADEDNESS WITH HYPOTENSION:  #  #  #  YES  #  #  #   Has patient had a PCN reaction causing severe rash involving mucus membranes or skin necrosis: no PATIENT HAS HAD A PCN REACTION THAT REQUIRED HOSPITALIZATION:  #  #  #  YES  #  #  #  Has patient had a PCN reaction occurring within the last 10 years: no   . Codeine     "my wife says I'm allergic to this" pt unsure of a reaction that he has    Past Medical History, Surgical history, Social history, and Family History were reviewed and updated.  Review of Systems: Review of Systems  Constitutional: Negative.   HENT: Negative.   Eyes: Negative.   Respiratory: Negative.   Cardiovascular: Negative.   Gastrointestinal: Negative.   Genitourinary: Negative.   Musculoskeletal: Negative.   Skin: Negative.   Neurological: Negative.   Endo/Heme/Allergies: Negative.   Psychiatric/Behavioral: Negative.      Physical Exam:  weight is 181 lb (82.1 kg). His oral temperature is 98.7 F (37.1 C). His blood pressure is 122/67 and his pulse is 80. His oxygen saturation is 100%.   Wt Readings from Last 3 Encounters:  03/24/20 181 lb (82.1 kg)  12/23/19 185 lb 12 oz (84.3  kg)  11/11/19 182 lb 1.3 oz (82.6 kg)    Physical Exam Vitals reviewed.  HENT:     Head: Normocephalic and atraumatic.  Eyes:     Pupils: Pupils are equal, round, and reactive to light.  Cardiovascular:     Rate and Rhythm: Normal rate and regular rhythm.     Heart sounds: Normal heart sounds.  Pulmonary:     Effort: Pulmonary effort is normal.     Breath sounds: Normal breath sounds.  Abdominal:     General: Bowel sounds are normal.     Palpations: Abdomen is soft.  Musculoskeletal:        General: No tenderness or deformity. Normal range of motion.     Cervical back: Normal range of motion.  Lymphadenopathy:     Cervical: No cervical adenopathy.  Skin:    General: Skin is warm and dry.     Findings: No erythema or rash.  Neurological:     Mental Status: He is alert and oriented to person, place, and time.  Psychiatric:        Behavior: Behavior normal.        Thought Content: Thought content normal.        Judgment: Judgment normal.      Lab Results  Component Value Date   WBC 8.3 03/24/2020   HGB 13.9 03/24/2020   HCT 42.2 03/24/2020   MCV 99.3 03/24/2020   PLT 264 03/24/2020   Lab Results  Component Value Date   FERRITIN 94 02/08/2020   IRON 94 02/08/2020   TIBC 284 02/08/2020   UIBC 190 02/08/2020   IRONPCTSAT 33 02/08/2020   Lab Results  Component Value Date   RETICCTPCT 1.4 07/06/2019   RBC 4.25 03/24/2020   No results found for: KPAFRELGTCHN, LAMBDASER, KAPLAMBRATIO No results found for: IGGSERUM, IGA, IGMSERUM No results found for: Marda Stalker, SPEI   Chemistry      Component Value Date/Time   NA 142 03/24/2020 1130   K 3.8 03/24/2020 1130   CL 103 03/24/2020 1130   CO2 33 (H) 03/24/2020 1130   BUN 17 03/24/2020 1130   CREATININE 0.98 03/24/2020 1130      Component Value Date/Time   CALCIUM 9.6 03/24/2020 1130   ALKPHOS 56 03/24/2020 1130   AST 19 03/24/2020 1130   ALT 12  03/24/2020 1130   BILITOT 0.4 03/24/2020 1130       Impression and Plan: Corey Carter is a very pleasant 74 yo caucasian  gentleman with history of hemochromatosis, homozygous for the H63D mutation.  We certainly have a lot of flexibility with doing phlebotomies on him.  I think we can probably hold on phlebotomies for right now.  We will get him through the summertime and plan to get him back in the fall.  I am sure that he will have a lot of tomatoes that he will give out to his church.     Volanda Napoleon, MD 6/24/202112:51 PM

## 2020-03-25 LAB — IRON AND TIBC
Iron: 125 ug/dL (ref 42–163)
Saturation Ratios: 41 % (ref 20–55)
TIBC: 309 ug/dL (ref 202–409)
UIBC: 184 ug/dL (ref 117–376)

## 2020-03-25 LAB — FERRITIN: Ferritin: 93 ng/mL (ref 24–336)

## 2020-03-28 ENCOUNTER — Other Ambulatory Visit: Payer: Self-pay | Admitting: *Deleted

## 2020-03-28 ENCOUNTER — Inpatient Hospital Stay: Payer: Medicare HMO

## 2020-03-28 ENCOUNTER — Telehealth: Payer: Self-pay | Admitting: *Deleted

## 2020-03-28 ENCOUNTER — Other Ambulatory Visit: Payer: Self-pay

## 2020-03-28 NOTE — Progress Notes (Signed)
Corey Carter presents today for phlebotomy per MD orders. Phlebotomy procedure started at 1215 and ended at 1230 266 grams removed via 18 gauge needle to right AC.  Unable to obtain more during this phlebotomy, okay per Dr.Ennever with the 266 grams obtained, no more obtained during this visit.  Patient observed for 30 minutes after procedure without any incident. Patient tolerated procedure well. Patient understands to call if he has any questions or concerns post discharge.

## 2020-03-28 NOTE — Telephone Encounter (Addendum)
-----   Message from Josph Macho, MD sent at 03/25/2020  5:35 PM EDT ----- Patient called to let him know that the iron is actually a little on the high side.  He needs to have 1 phlebotomy

## 2020-03-28 NOTE — Patient Instructions (Signed)

## 2020-08-01 ENCOUNTER — Telehealth: Payer: Self-pay | Admitting: Hematology & Oncology

## 2020-08-01 ENCOUNTER — Other Ambulatory Visit: Payer: Self-pay

## 2020-08-01 ENCOUNTER — Inpatient Hospital Stay: Payer: Medicare HMO | Attending: Hematology & Oncology

## 2020-08-01 ENCOUNTER — Inpatient Hospital Stay (HOSPITAL_BASED_OUTPATIENT_CLINIC_OR_DEPARTMENT_OTHER): Payer: Medicare HMO | Admitting: Hematology & Oncology

## 2020-08-01 DIAGNOSIS — Z7982 Long term (current) use of aspirin: Secondary | ICD-10-CM | POA: Diagnosis not present

## 2020-08-01 LAB — CBC WITH DIFFERENTIAL (CANCER CENTER ONLY)
Abs Immature Granulocytes: 0.02 10*3/uL (ref 0.00–0.07)
Basophils Absolute: 0.1 10*3/uL (ref 0.0–0.1)
Basophils Relative: 1 %
Eosinophils Absolute: 0.2 10*3/uL (ref 0.0–0.5)
Eosinophils Relative: 3 %
HCT: 40.6 % (ref 39.0–52.0)
Hemoglobin: 13.4 g/dL (ref 13.0–17.0)
Immature Granulocytes: 0 %
Lymphocytes Relative: 36 %
Lymphs Abs: 2.3 10*3/uL (ref 0.7–4.0)
MCH: 32.4 pg (ref 26.0–34.0)
MCHC: 33 g/dL (ref 30.0–36.0)
MCV: 98.3 fL (ref 80.0–100.0)
Monocytes Absolute: 0.8 10*3/uL (ref 0.1–1.0)
Monocytes Relative: 13 %
Neutro Abs: 3 10*3/uL (ref 1.7–7.7)
Neutrophils Relative %: 47 %
Platelet Count: 239 10*3/uL (ref 150–400)
RBC: 4.13 MIL/uL — ABNORMAL LOW (ref 4.22–5.81)
RDW: 11.9 % (ref 11.5–15.5)
WBC Count: 6.3 10*3/uL (ref 4.0–10.5)
nRBC: 0 % (ref 0.0–0.2)

## 2020-08-01 LAB — CMP (CANCER CENTER ONLY)
ALT: 12 U/L (ref 0–44)
AST: 18 U/L (ref 15–41)
Albumin: 4.1 g/dL (ref 3.5–5.0)
Alkaline Phosphatase: 58 U/L (ref 38–126)
Anion gap: 6 (ref 5–15)
BUN: 15 mg/dL (ref 8–23)
CO2: 36 mmol/L — ABNORMAL HIGH (ref 22–32)
Calcium: 9.9 mg/dL (ref 8.9–10.3)
Chloride: 101 mmol/L (ref 98–111)
Creatinine: 1.04 mg/dL (ref 0.61–1.24)
GFR, Estimated: >60 mL/min (ref >60)
Glucose, Bld: 96 mg/dL (ref 70–99)
Potassium: 4.5 mmol/L (ref 3.5–5.1)
Sodium: 143 mmol/L (ref 135–145)
Total Bilirubin: 0.3 mg/dL (ref 0.3–1.2)
Total Protein: 6.7 g/dL (ref 6.5–8.1)

## 2020-08-01 NOTE — Telephone Encounter (Signed)
Appointments scheduled calendar printed per 08/01/2020

## 2020-08-01 NOTE — Progress Notes (Signed)
Hematology and Oncology Follow Up Visit  Corey Carter [id number] September 30, 1946 74 y.o. 08/01/2020   Principle Diagnosis:  Hemochromatosis, Homozygous for the H63D mutation  Current Therapy:        Phlebotomy to maintain ferritin < 100 and iron saturation < 50% Aspirin 325 mg PO daily   Interim History:  Corey Carter is here today for follow-up.  Last saw him back in June.  He had a very nice summer.  He really did not do much in the way of any traveling.  We last saw him back in June, his ferritin was 93 with an iron saturation of 41%.  We have held off on a phlebotomize him.  He has had no problems with fever.  He has had no cough or shortness of breath.  He has had no nausea or vomiting.  He has had no change in bowel or bladder habits.  Currently, his performance status is ECOG 0.  Biopsy admits that he does have problems with his knees.  He needs knee surgery but he wants to hold off on this.    Medications:  Allergies as of 08/01/2020      Reactions   Penicillins Swelling   "SEVERE SWELLING OF JOINTS" PATIENT HAS HAD A PCN REACTION WITH IMMEDIATE RASH, FACIAL/TONGUE/THROAT SWELLING, SOB, OR LIGHTHEADEDNESS WITH HYPOTENSION:  #  #  #  YES  #  #  #   Has patient had a PCN reaction causing severe rash involving mucus membranes or skin necrosis: no PATIENT HAS HAD A PCN REACTION THAT REQUIRED HOSPITALIZATION:  #  #  #  YES  #  #  #   Has patient had a PCN reaction occurring within the last 10 years: no   Codeine    "my wife says I'm allergic to this" pt unsure of a reaction that he has      Medication List       Accurate as of August 01, 2020 12:19 PM. If you have any questions, ask your nurse or doctor.        aspirin EC 81 MG tablet Take 81 mg by mouth daily.   famotidine 20 MG tablet Commonly known as: PEPCID   HYDROcodone-acetaminophen 10-325 MG tablet Commonly known as: NORCO Take 1 tablet by mouth every 4 (four) hours as needed for moderate pain.    methocarbamol 750 MG tablet Commonly known as: ROBAXIN Take 1 tablet (750 mg total) by mouth 2 (two) times daily as needed for muscle spasms.   MIDNITE PM PO Take 1 tablet by mouth at bedtime as needed (sleep).   ondansetron 4 MG tablet Commonly known as: ZOFRAN Take 1-2 tablets (4-8 mg total) by mouth every 8 (eight) hours as needed for nausea or vomiting.   promethazine 25 MG tablet Commonly known as: PHENERGAN Take 1 tablet (25 mg total) by mouth every 6 (six) hours as needed for nausea.   senna-docusate 8.6-50 MG tablet Commonly known as: Senokot S Take 1 tablet by mouth at bedtime as needed.   traZODone 150 MG tablet Commonly known as: DESYREL       Allergies:  Allergies  Allergen Reactions  . Penicillins Swelling    "SEVERE SWELLING OF JOINTS"  PATIENT HAS HAD A PCN REACTION WITH IMMEDIATE RASH, FACIAL/TONGUE/THROAT SWELLING, SOB, OR LIGHTHEADEDNESS WITH HYPOTENSION:  #  #  #  YES  #  #  #   Has patient had a PCN reaction causing severe rash involving mucus membranes or skin necrosis: no  PATIENT HAS HAD A PCN REACTION THAT REQUIRED HOSPITALIZATION:  #  #  #  YES  #  #  #   Has patient had a PCN reaction occurring within the last 10 years: no   . Codeine     "my wife says I'm allergic to this" pt unsure of a reaction that he has    Past Medical History, Surgical history, Social history, and Family History were reviewed and updated.  Review of Systems: Review of Systems  Constitutional: Negative.   HENT: Negative.   Eyes: Negative.   Respiratory: Negative.   Cardiovascular: Negative.   Gastrointestinal: Negative.   Genitourinary: Negative.   Musculoskeletal: Negative.   Skin: Negative.   Neurological: Negative.   Endo/Heme/Allergies: Negative.   Psychiatric/Behavioral: Negative.      Physical Exam:  weight is 187 lb (84.8 kg). His oral temperature is 98.3 F (36.8 C). His blood pressure is 129/68 and his pulse is 66. His respiration is 18 and oxygen  saturation is 99%.   Wt Readings from Last 3 Encounters:  08/01/20 187 lb (84.8 kg)  03/24/20 181 lb (82.1 kg)  12/23/19 185 lb 12 oz (84.3 kg)    Physical Exam Vitals reviewed.  HENT:     Head: Normocephalic and atraumatic.  Eyes:     Pupils: Pupils are equal, round, and reactive to light.  Cardiovascular:     Rate and Rhythm: Normal rate and regular rhythm.     Heart sounds: Normal heart sounds.  Pulmonary:     Effort: Pulmonary effort is normal.     Breath sounds: Normal breath sounds.  Abdominal:     General: Bowel sounds are normal.     Palpations: Abdomen is soft.  Musculoskeletal:        General: No tenderness or deformity. Normal range of motion.     Cervical back: Normal range of motion.  Lymphadenopathy:     Cervical: No cervical adenopathy.  Skin:    General: Skin is warm and dry.     Findings: No erythema or rash.  Neurological:     Mental Status: He is alert and oriented to person, place, and time.  Psychiatric:        Behavior: Behavior normal.        Thought Content: Thought content normal.        Judgment: Judgment normal.      Lab Results  Component Value Date   WBC 6.3 08/01/2020   HGB 13.4 08/01/2020   HCT 40.6 08/01/2020   MCV 98.3 08/01/2020   PLT 239 08/01/2020   Lab Results  Component Value Date   FERRITIN 93 03/24/2020   IRON 125 03/24/2020   TIBC 309 03/24/2020   UIBC 184 03/24/2020   IRONPCTSAT 41 03/24/2020   Lab Results  Component Value Date   RETICCTPCT 1.4 07/06/2019   RBC 4.13 (L) 08/01/2020   No results found for: KPAFRELGTCHN, LAMBDASER, KAPLAMBRATIO No results found for: IGGSERUM, IGA, IGMSERUM No results found for: Marda Stalker, SPEI   Chemistry      Component Value Date/Time   NA 143 08/01/2020 1139   K 4.5 08/01/2020 1139   CL 101 08/01/2020 1139   CO2 36 (H) 08/01/2020 1139   BUN 15 08/01/2020 1139   CREATININE 1.04 08/01/2020 1139      Component Value  Date/Time   CALCIUM 9.9 08/01/2020 1139   ALKPHOS 58 08/01/2020 1139   AST 18 08/01/2020 1139  ALT 12 08/01/2020 1139   BILITOT 0.3 08/01/2020 1139       Impression and Plan: Corey Carter is a very pleasant 74 yo caucasian gentleman with history of hemochromatosis, homozygous for the H63D mutation.  We certainly have a lot of flexibility with doing phlebotomies on him.  I will see what his iron studies look like.  Is certainly conceivable that we may need to phlebotomize him.  I think we can probably get him back in 6 months.  I think this is very reasonable.  I do still think that iron overload is going be a real problem for him.    Josph Macho, MD 11/1/202112:19 PM

## 2020-08-02 LAB — IRON AND TIBC
Iron: 107 ug/dL (ref 42–163)
Saturation Ratios: 37 % (ref 20–55)
TIBC: 290 ug/dL (ref 202–409)
UIBC: 183 ug/dL (ref 117–376)

## 2020-08-02 LAB — FERRITIN: Ferritin: 138 ng/mL (ref 24–336)

## 2021-01-30 ENCOUNTER — Inpatient Hospital Stay: Payer: Medicare Other | Attending: Family Medicine

## 2021-01-30 ENCOUNTER — Inpatient Hospital Stay: Payer: Medicare Other | Admitting: Hematology & Oncology

## 2021-03-03 IMAGING — CR LEFT RIBS - 2 VIEW
3 series · 3 of 3 positions shown · non-contrast
Comparison: None.

CLINICAL DATA: Acute left rib pain without known injury.

EXAM:
LEFT RIBS - 2 VIEW

[w ribs ap upper left]
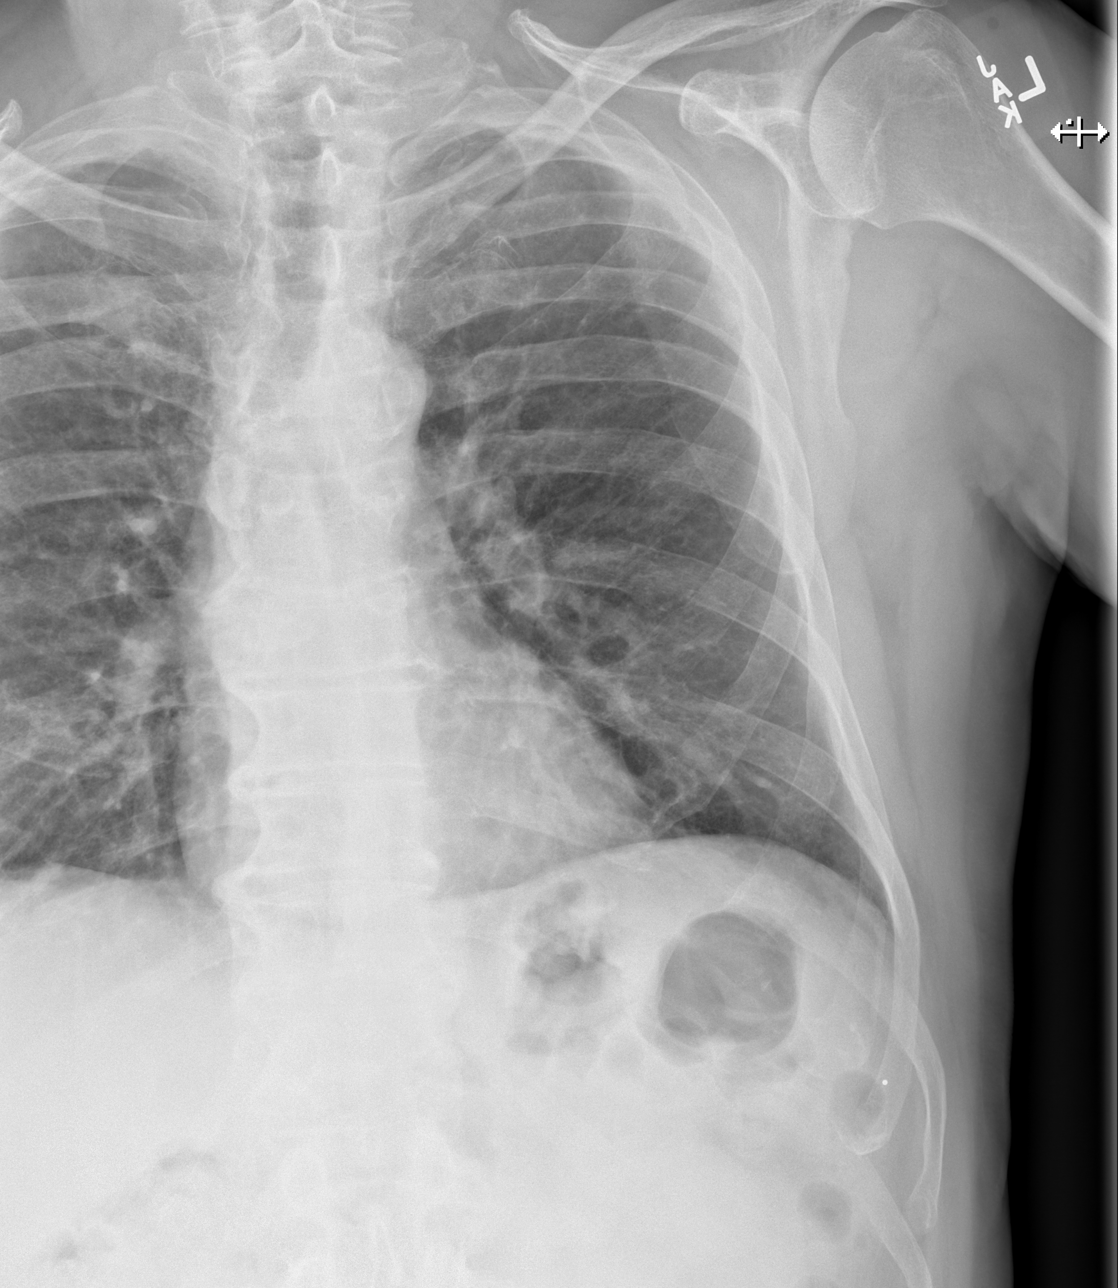

[w ribs ap lower left]
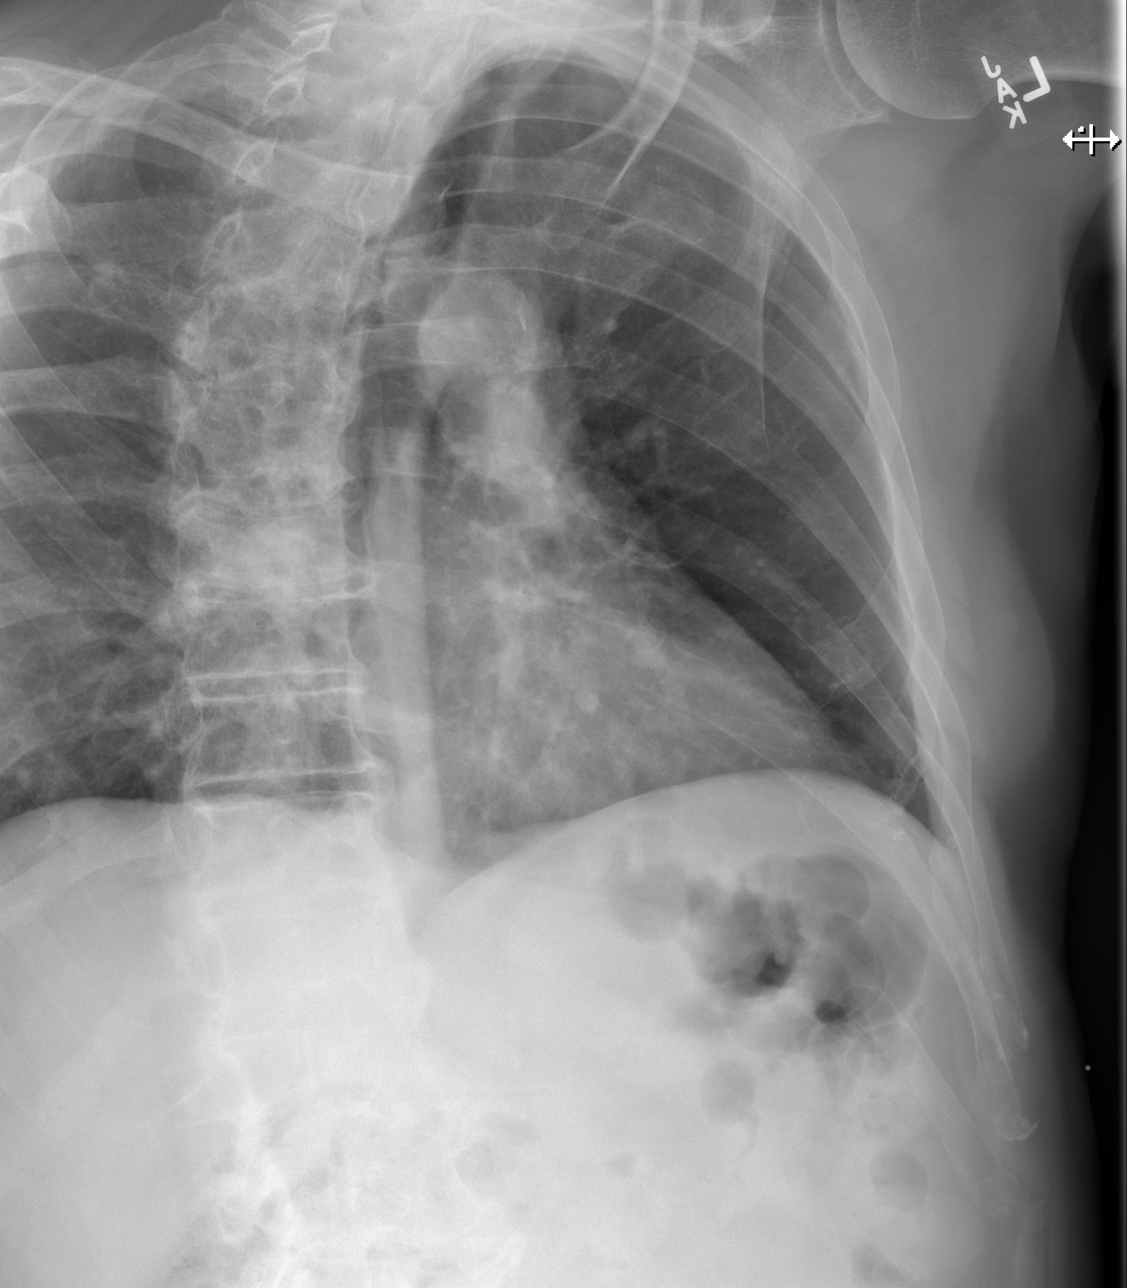

[w ribs obl left]
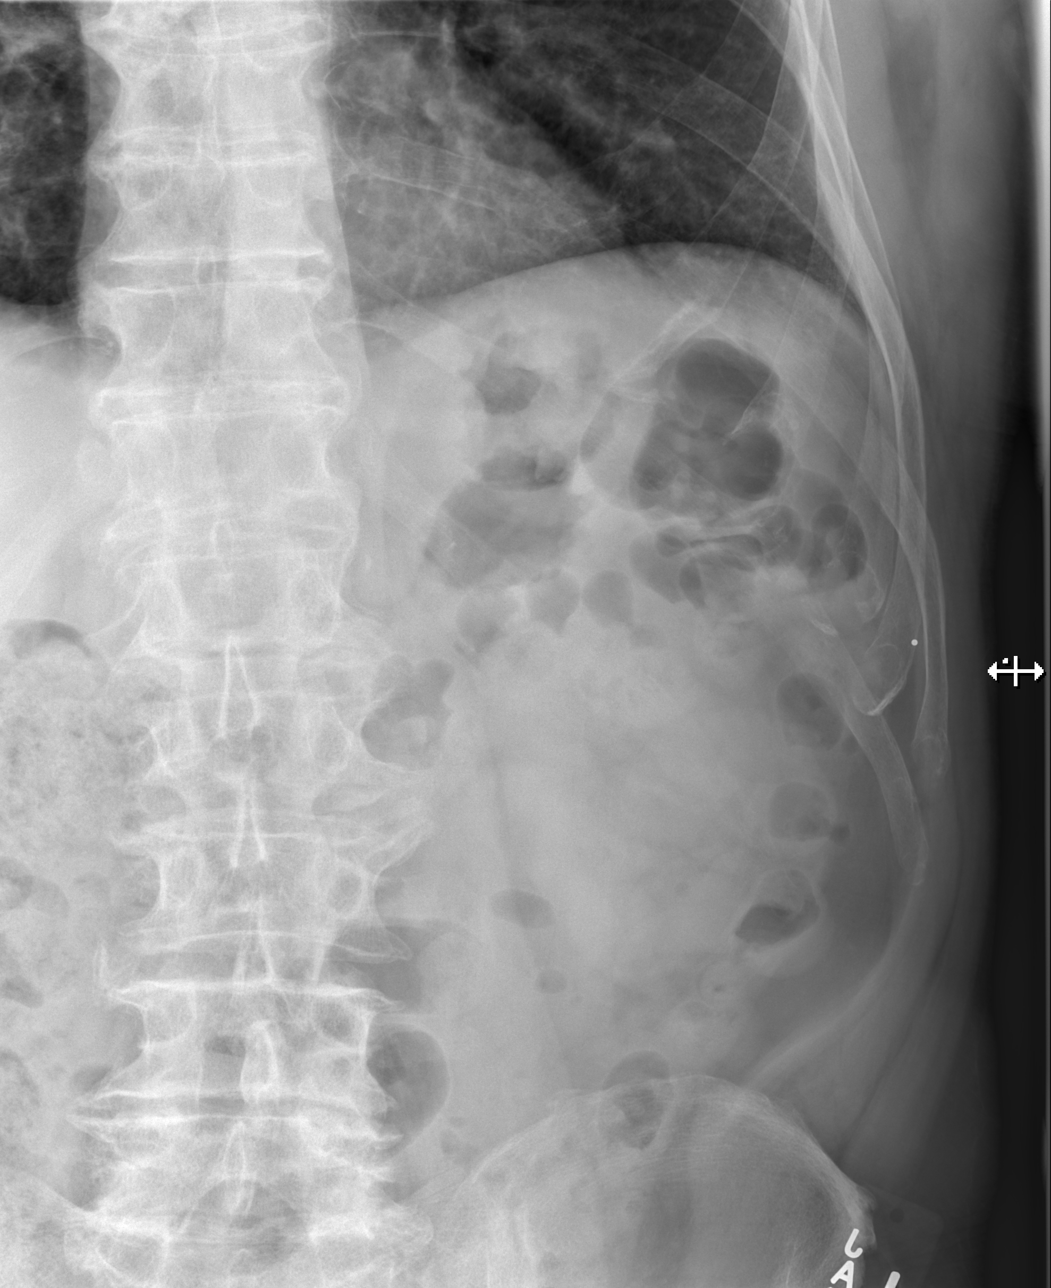

[3 of 3 positions shown; findings below may reference images not displayed]

FINDINGS: No fracture or other bone lesions are seen involving the ribs.
IMPRESSION: Negative.

## 2021-03-03 IMAGING — CR CHEST - 2 VIEW
2 series · 2 of 2 positions shown · non-contrast
Comparison: 12/16/2012

CLINICAL DATA: 72-year-old male with a history of lower anterior
rib pain

EXAM:
CHEST - 2 VIEW

[w chest pa]
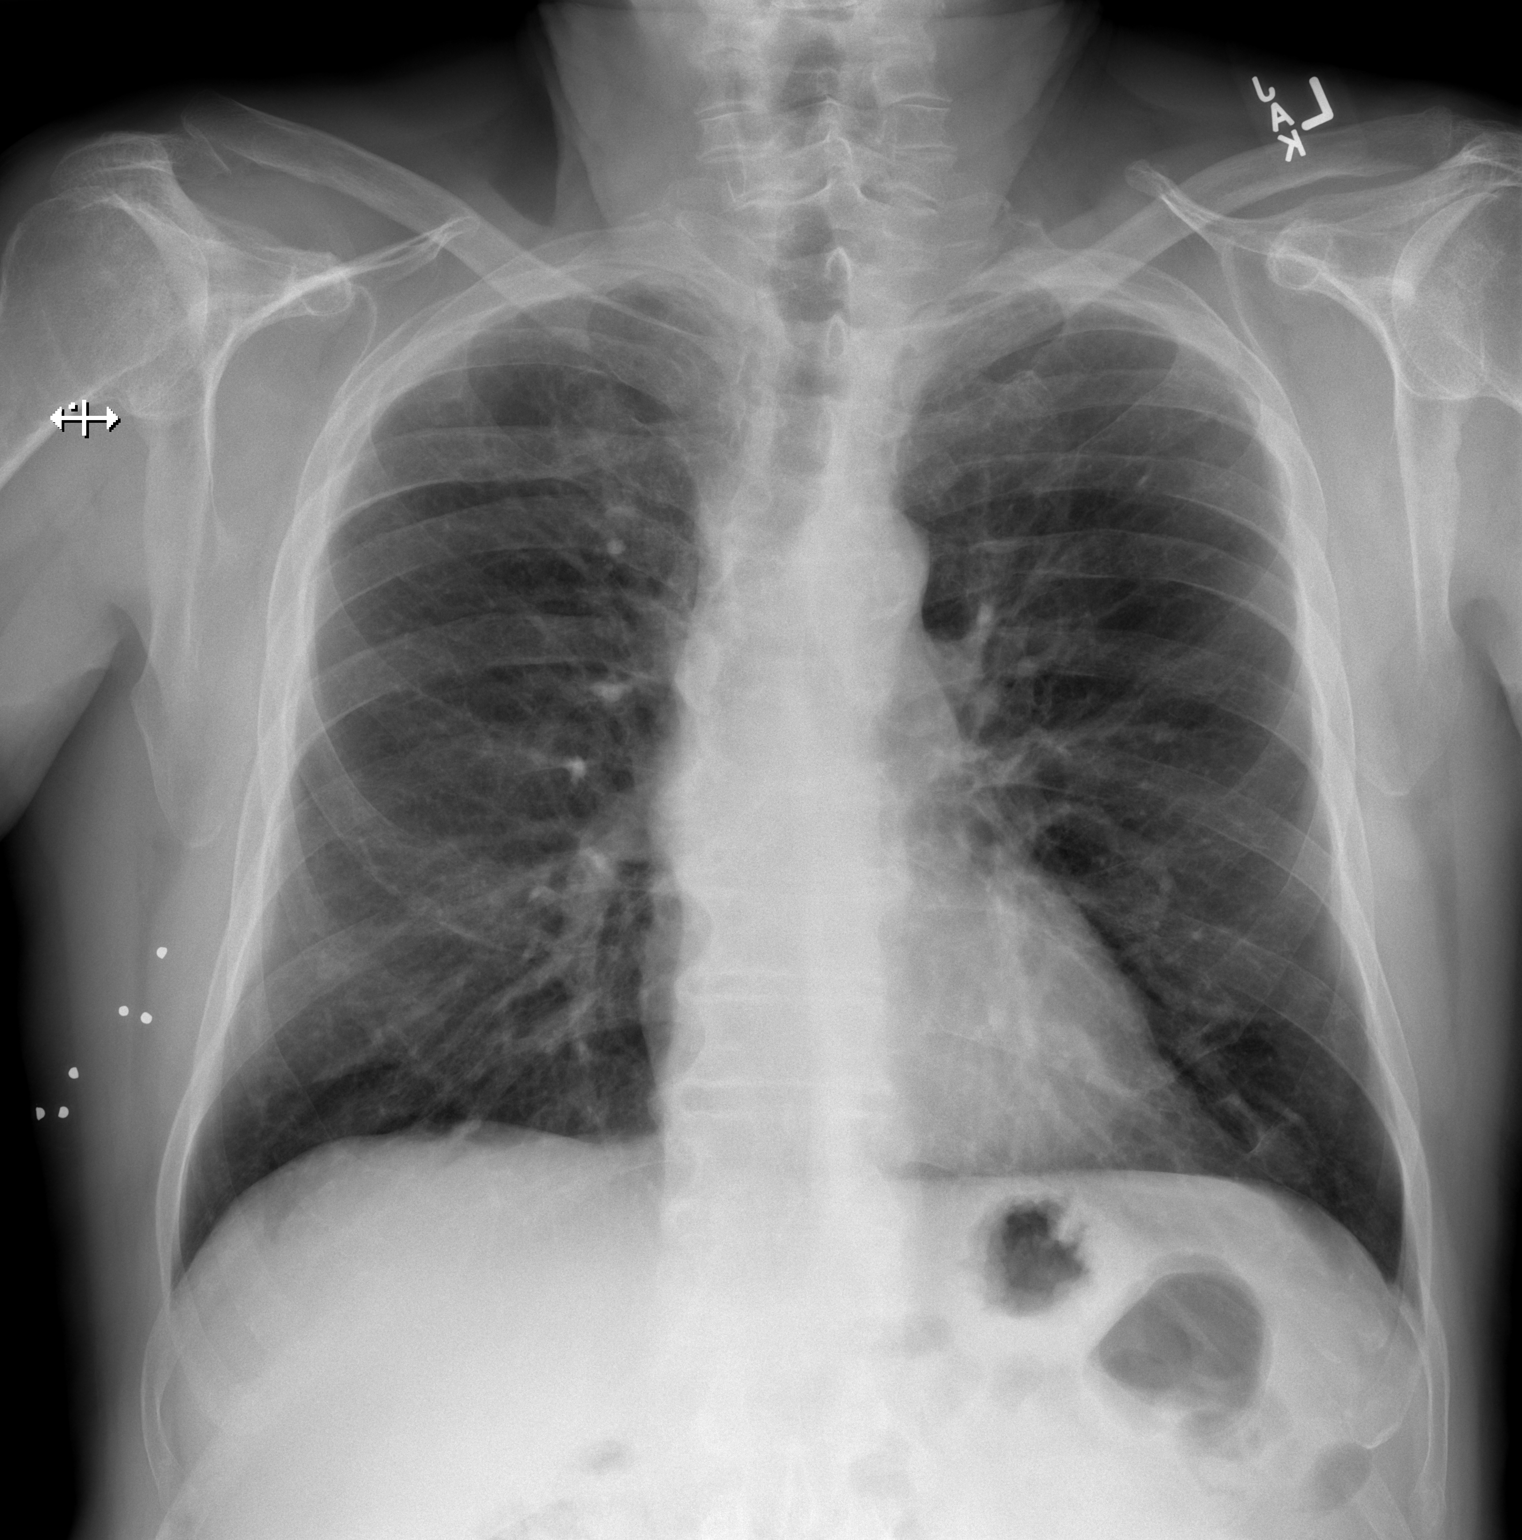

[w chest lat]
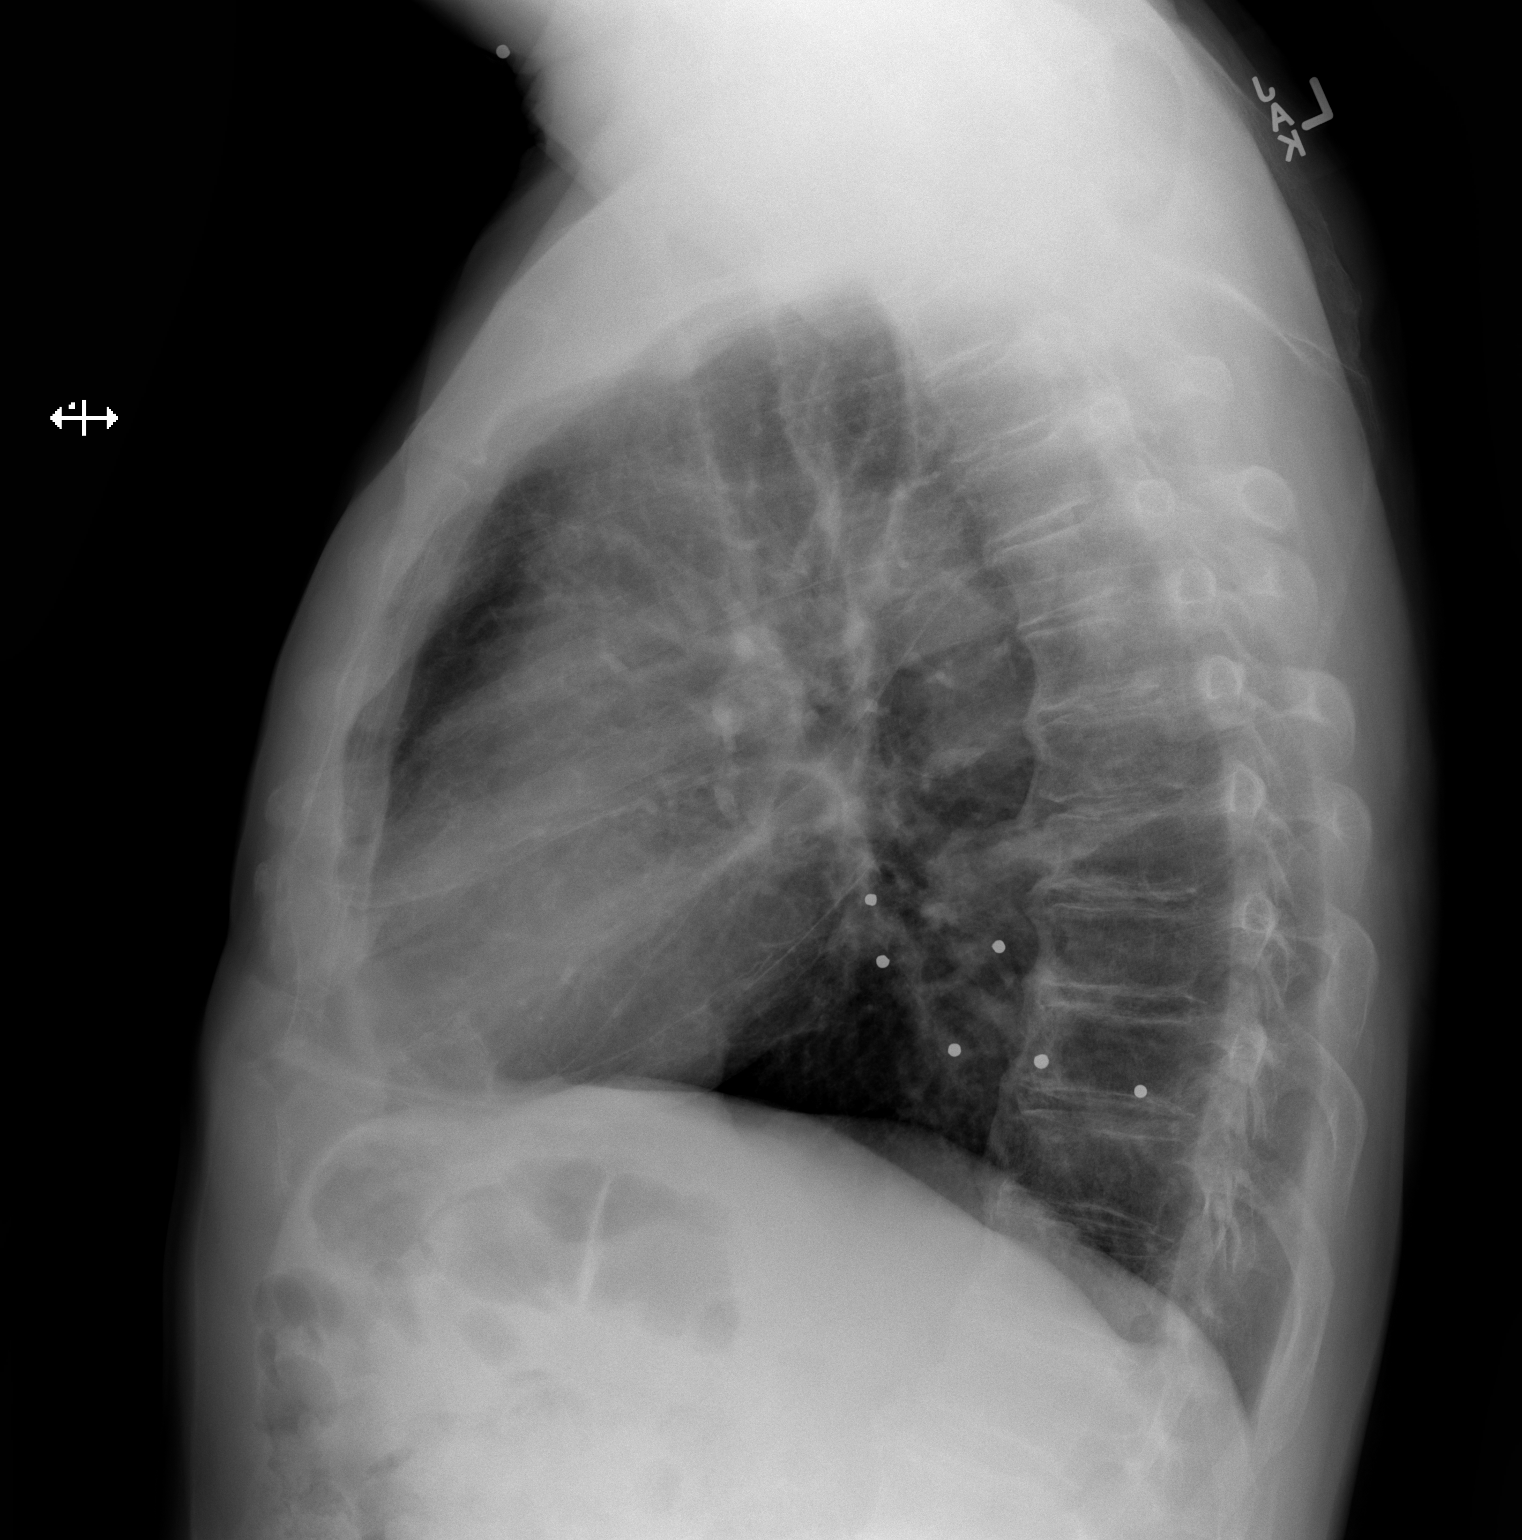

[2 of 2 positions shown; findings below may reference images not displayed]

FINDINGS: Cardiomediastinal silhouette unchanged in size and contour.
Coarsened interstitial markings. Pleuroparenchymal thickening at the
apices. No pneumothorax or pleural effusion. No confluent airspace
disease. Degenerative changes of the spine. No displaced fracture.

Redemonstration of radiopaque shrapnel on the right chest wall.

Degenerative changes of the shoulders.
IMPRESSION: Chronic lung changes without evidence of acute cardiopulmonary
disease.

Radiopaque shrapnel of the right chest wall again demonstrated.

## 2021-11-14 ENCOUNTER — Encounter: Payer: Self-pay | Admitting: Family

## 2022-10-08 ENCOUNTER — Encounter: Payer: Self-pay | Admitting: Family

## 2022-10-08 ENCOUNTER — Other Ambulatory Visit (HOSPITAL_COMMUNITY): Payer: Self-pay

## 2022-10-08 MED ORDER — HYDROCODONE-ACETAMINOPHEN 10-325 MG PO TABS
1.0000 | ORAL_TABLET | ORAL | 0 refills | Status: AC | PRN
  Filled 2022-10-08: qty 180, 30d supply, fill #0

## 2022-11-06 ENCOUNTER — Other Ambulatory Visit (HOSPITAL_COMMUNITY): Payer: Self-pay

## 2022-11-07 ENCOUNTER — Other Ambulatory Visit (HOSPITAL_COMMUNITY): Payer: Self-pay

## 2022-11-07 MED ORDER — HYDROCODONE-ACETAMINOPHEN 10-325 MG PO TABS
ORAL_TABLET | ORAL | 0 refills | Status: AC
  Filled 2022-11-07: qty 180, 30d supply, fill #0

## 2022-12-05 ENCOUNTER — Other Ambulatory Visit (HOSPITAL_COMMUNITY): Payer: Self-pay

## 2022-12-05 MED ORDER — HYDROCODONE-ACETAMINOPHEN 10-325 MG PO TABS: ORAL_TABLET | ORAL | 0 refills | Status: AC

## 2022-12-07 ENCOUNTER — Other Ambulatory Visit (HOSPITAL_COMMUNITY): Payer: Self-pay
